# Patient Record
Sex: Female | Born: 1935
Health system: Southern US, Community
[De-identification: ages and names within clinical notes are randomized; demographics above are authoritative.]

## PROBLEM LIST (undated history)

## (undated) DIAGNOSIS — E669 Obesity, unspecified: Secondary | ICD-10-CM

## (undated) DIAGNOSIS — K219 Gastro-esophageal reflux disease without esophagitis: Secondary | ICD-10-CM

## (undated) DIAGNOSIS — E119 Type 2 diabetes mellitus without complications: Secondary | ICD-10-CM

## (undated) DIAGNOSIS — M069 Rheumatoid arthritis, unspecified: Secondary | ICD-10-CM

## (undated) DIAGNOSIS — I10 Essential (primary) hypertension: Secondary | ICD-10-CM

## (undated) DIAGNOSIS — K922 Gastrointestinal hemorrhage, unspecified: Secondary | ICD-10-CM

## (undated) DIAGNOSIS — G8929 Other chronic pain: Secondary | ICD-10-CM

## (undated) DIAGNOSIS — M1990 Unspecified osteoarthritis, unspecified site: Secondary | ICD-10-CM

## (undated) DIAGNOSIS — Z789 Other specified health status: Secondary | ICD-10-CM

## (undated) DIAGNOSIS — M797 Fibromyalgia: Secondary | ICD-10-CM

## (undated) DIAGNOSIS — M542 Cervicalgia: Secondary | ICD-10-CM

## (undated) HISTORY — PX: TOTAL HIP ARTHROPLASTY: SHX124

## (undated) HISTORY — PX: JOINT REPLACEMENT: SHX530

## (undated) HISTORY — PX: TUBAL LIGATION: SHX77

---

## 2012-11-21 DIAGNOSIS — H04129 Dry eye syndrome of unspecified lacrimal gland: Secondary | ICD-10-CM | POA: Diagnosis not present

## 2012-11-21 DIAGNOSIS — H25019 Cortical age-related cataract, unspecified eye: Secondary | ICD-10-CM | POA: Diagnosis not present

## 2012-11-21 DIAGNOSIS — E119 Type 2 diabetes mellitus without complications: Secondary | ICD-10-CM | POA: Diagnosis not present

## 2012-11-21 DIAGNOSIS — H2510 Age-related nuclear cataract, unspecified eye: Secondary | ICD-10-CM | POA: Diagnosis not present

## 2012-11-29 ENCOUNTER — Emergency Department (INDEPENDENT_AMBULATORY_CARE_PROVIDER_SITE_OTHER)
Admission: EM | Admit: 2012-11-29 | Discharge: 2012-11-29 | Disposition: A | Discharge status: Home / Self Care | Payer: Medicare Other | Source: Home / Self Care | Attending: Emergency Medicine | Admitting: Emergency Medicine

## 2012-11-29 ENCOUNTER — Encounter (HOSPITAL_COMMUNITY): Payer: Self-pay | Admitting: *Deleted

## 2012-11-29 ENCOUNTER — Emergency Department (INDEPENDENT_AMBULATORY_CARE_PROVIDER_SITE_OTHER): Payer: Medicare Other

## 2012-11-29 DIAGNOSIS — M47812 Spondylosis without myelopathy or radiculopathy, cervical region: Secondary | ICD-10-CM | POA: Diagnosis not present

## 2012-11-29 DIAGNOSIS — M542 Cervicalgia: Secondary | ICD-10-CM

## 2012-11-29 DIAGNOSIS — I10 Essential (primary) hypertension: Secondary | ICD-10-CM

## 2012-11-29 DIAGNOSIS — M5030 Other cervical disc degeneration, unspecified cervical region: Secondary | ICD-10-CM | POA: Diagnosis not present

## 2012-11-29 HISTORY — DX: Essential (primary) hypertension: I10

## 2012-11-29 HISTORY — DX: Unspecified osteoarthritis, unspecified site: M19.90

## 2012-11-29 HISTORY — DX: Type 2 diabetes mellitus without complications: E11.9

## 2012-11-29 HISTORY — DX: Obesity, unspecified: E66.9

## 2012-11-29 HISTORY — DX: Rheumatoid arthritis, unspecified: M06.9

## 2012-11-29 HISTORY — DX: Fibromyalgia: M79.7

## 2012-11-29 LAB — POCT I-STAT, CHEM 8
Chloride: 99 mEq/L (ref 96–112)
Glucose, Bld: 164 mg/dL — ABNORMAL HIGH (ref 70–99)
HCT: 45 % (ref 36.0–46.0)
Potassium: 3.7 mEq/L (ref 3.5–5.1)
Sodium: 139 mEq/L (ref 135–145)

## 2012-11-29 MED ORDER — DIAZEPAM 2 MG PO TABS: 2.0000 mg | ORAL_TABLET | Freq: Four times a day (QID) | ORAL | Status: DC | PRN

## 2012-11-29 MED ORDER — MELOXICAM 7.5 MG PO TABS: 7.5000 mg | ORAL_TABLET | Freq: Every day | ORAL | Status: DC

## 2012-11-29 MED ORDER — TRAMADOL HCL 50 MG PO TABS: 50.0000 mg | ORAL_TABLET | Freq: Four times a day (QID) | ORAL | Status: DC | PRN

## 2012-11-29 MED ORDER — HYDROCHLOROTHIAZIDE 25 MG PO TABS: 25.0000 mg | ORAL_TABLET | Freq: Every day | ORAL | Status: DC

## 2012-11-29 NOTE — ED Provider Notes (Signed)
History     CSN: [id number]  Arrival date & time 11/29/12  1458   First MD Initiated Contact with Patient 11/29/12 1744      Chief Complaint  Patient presents with  . Neck Pain  . Arm Pain  . Shoulder Pain    (Consider location/radiation/quality/duration/timing/severity/associated sxs/prior treatment) Patient is a 77 y.o. female presenting with neck pain and hypertension. The history is provided by the patient.  Neck Pain  This is a new problem. The current episode started more than 1 week ago (many months ago). The problem occurs daily. The problem has not changed since onset.The pain is associated with nothing. There has been no fever. The pain is present in the left side. The quality of the pain is described as aching. The pain radiates to the left shoulder and left arm. The pain is at a severity of 9/10. The symptoms are aggravated by bending, twisting and position. The pain is the same all the time. Stiffness is present in the morning. Associated symptoms include tingling. Pertinent negatives include no chest pain and no headaches. She has tried nothing for the symptoms.  Hypertension This is a chronic problem. The problem has not changed since onset.Pertinent negatives include no chest pain, no abdominal pain, no headaches and no shortness of breath. Nothing aggravates the symptoms. The symptoms are relieved by medications. She has tried nothing for the symptoms.  Patient reports she stopped taking medications related to abdominal pain many months ago.    Past Medical History  Diagnosis Date  . Diabetes mellitus without complication   . Hypertension   . Rheumatoid arthritis   . Osteoarthritis   . Fibromyalgia   . Obesity     Past Surgical History  Procedure Date  . Joint replacement     bilat hips  . Tubal ligation     No family history on file.  History  Substance Use Topics  . Smoking status: Never Smoker   . Smokeless tobacco: Not on file  . Alcohol Use: No     OB History    Grav Para Term Preterm Abortions TAB SAB Ect Mult Living                  Review of Systems  HENT: Positive for neck pain.   Respiratory: Negative for shortness of breath.   Cardiovascular: Negative for chest pain.  Gastrointestinal: Negative for abdominal pain.  Musculoskeletal: Positive for arthralgias.  Neurological: Positive for tingling. Negative for headaches.  All other systems reviewed and are negative.    Allergies  Penicillins  Home Medications   Current Outpatient Rx  Name  Route  Sig  Dispense  Refill  . NOVOLOG [place]   Subcutaneous   Inject 25 Units into the skin 2 (two) times daily.         . TRAMADOL HCL PO   Oral   Take by mouth 2 (two) times daily.         Marland Kitchen DIAZEPAM 2 MG PO TABS   Oral   Take 1 tablet (2 mg total) by mouth every 6 (six) hours as needed.   20 tablet   0   . HYDROCHLOROTHIAZIDE 25 MG PO TABS   Oral   Take 1 tablet (25 mg total) by mouth daily.   30 tablet   0   . MELOXICAM 7.5 MG PO TABS   Oral   Take 1 tablet (7.5 mg total) by mouth daily.   30 tablet   1   .  TRAMADOL HCL 50 MG PO TABS   Oral   Take 1 tablet (50 mg total) by mouth every 6 (six) hours as needed for pain.   30 tablet   0     BP 196/100  Pulse 91  Temp 98.3 F (36.8 C) (Oral)  Resp 18  SpO2 98%  Physical Exam  Nursing note and vitals reviewed. Constitutional: She is oriented to person, place, and time. Vital signs are normal. She appears well-developed and well-nourished. She is active and cooperative. No distress.  HENT:  Head: Normocephalic.  Eyes: Conjunctivae normal and EOM are normal. Pupils are equal, round, and reactive to light. No scleral icterus.  Neck: Trachea normal. Neck supple. No hepatojugular reflux present. Muscular tenderness present. No spinous process tenderness present. Carotid bruit is not present. Decreased range of motion present. No thyromegaly present.       Left sided muscle tenderness   Cardiovascular: Normal rate, regular rhythm, normal heart sounds, intact distal pulses and normal pulses.        No pedal edema  Pulmonary/Chest: Effort normal and breath sounds normal.  Lymphadenopathy:       Head (right side): No submental, no submandibular, no tonsillar, no preauricular, no posterior auricular and no occipital adenopathy present.       Head (left side): No submental, no submandibular, no tonsillar, no preauricular, no posterior auricular and no occipital adenopathy present.    She has no cervical adenopathy.  Neurological: She is alert and oriented to person, place, and time. She has normal strength. No cranial nerve deficit or sensory deficit. Coordination and gait normal. GCS eye subscore is 4. GCS verbal subscore is 5. GCS motor subscore is 6.  Skin: Skin is warm and dry. She is not diaphoretic.  Psychiatric: She has a normal mood and affect. Her speech is normal and behavior is normal. Judgment and thought content normal. Cognition and memory are normal.    ED Course  Procedures (including critical care time)  Labs Reviewed  POCT I-STAT, CHEM 8 - Abnormal; Notable for the following:    Glucose, Bld 164 (*)     Hemoglobin 15.3 (*)     All other components within normal limits   Dg Cervical Spine Complete  11/29/2012  *RADIOLOGY REPORT*  Clinical Data: Neck pain.  Left shoulder and arm pain.  CERVICAL SPINE - COMPLETE 4+ VIEW  Comparison: None.  Findings: There is prevertebral soft tissue swelling.  Does the patient have a fever or sore throat?  There is only minimal narrowing of the C3-4 disc space.  There is 3 mm spondylolisthesis of C3 on C4 and 2 mm spondylolisthesis at C4- C5.  The patient has severe right facet arthritis at C2-3, C3-4, and C4-5 and severe left facet arthritis at C1-2.  The left neural foramina are widely patent.  There is a right foraminal stenosis at C3-4.  There is calcification in the nuchal ligament at C5 06/07.  IMPRESSION: Prominence of the  prevertebral soft tissues.  This could be due to the patient's body habitus but the possibility of infection should be considered if the patient has any symptoms of infection.  Multilevel degenerative facet arthritis.   Original Report Authenticated By: Francene Boyers, M.D.      1. Cervicalgia   2. Hypertension       MDM  Medications as prescribed, follow up with [facility] for effective management of your chronic health condition.  Keep a log of your blood pressure for review.  RTC  prn       Johnsie Kindred, NP 11/29/12 1905

## 2012-11-29 NOTE — ED Notes (Signed)
C/O chronic left neck pain that radiates into left shoulder and down into RUE with intermittent tingling in left hand & all digits.  Has had this pain x 1 yr & has been taking tramadol bid for pain; came to [facility] today because "the pain is so bad, I just can't take it anymore; I can't sleep".  Has not f/u w/ PCP.  Neck pain feels like a stiffness; has difficulty moving head due to pain.  Denies HA.  Pt is supposed to be taking Lantus and lisinopril, but has not taken either x 1 month due to side effects.  Strongly encouraged pt to f/u w/ PCP.  Checks CBGs regularly, and states it is well-controlled and normally runs around 180.

## 2012-11-30 NOTE — ED Provider Notes (Signed)
Medical screening examination/treatment/procedure(s) were performed by non-physician practitioner and as supervising physician I was immediately available for consultation/collaboration.  Leslee Home, M.D.   Reuben Likes, MD 11/30/12 [phone]

## 2013-10-24 ENCOUNTER — Encounter (HOSPITAL_COMMUNITY): Payer: Self-pay | Admitting: Emergency Medicine

## 2013-10-24 DIAGNOSIS — IMO0001 Reserved for inherently not codable concepts without codable children: Secondary | ICD-10-CM | POA: Diagnosis not present

## 2013-10-24 DIAGNOSIS — M069 Rheumatoid arthritis, unspecified: Secondary | ICD-10-CM | POA: Insufficient documentation

## 2013-10-24 DIAGNOSIS — E119 Type 2 diabetes mellitus without complications: Secondary | ICD-10-CM | POA: Insufficient documentation

## 2013-10-24 DIAGNOSIS — L988 Other specified disorders of the skin and subcutaneous tissue: Secondary | ICD-10-CM | POA: Diagnosis not present

## 2013-10-24 DIAGNOSIS — I10 Essential (primary) hypertension: Secondary | ICD-10-CM | POA: Insufficient documentation

## 2013-10-24 DIAGNOSIS — Z794 Long term (current) use of insulin: Secondary | ICD-10-CM | POA: Insufficient documentation

## 2013-10-24 DIAGNOSIS — M1990 Unspecified osteoarthritis, unspecified site: Secondary | ICD-10-CM | POA: Insufficient documentation

## 2013-10-24 DIAGNOSIS — Z880 Allergy status to penicillin: Secondary | ICD-10-CM | POA: Insufficient documentation

## 2013-10-24 DIAGNOSIS — E669 Obesity, unspecified: Secondary | ICD-10-CM | POA: Diagnosis not present

## 2013-10-24 NOTE — ED Notes (Signed)
The pt is c/o a lesion on her lt thigh that  It has been there for 3 years and is getting worse and more painful.  She is a diabetic

## 2013-10-25 ENCOUNTER — Emergency Department (HOSPITAL_COMMUNITY)
Admission: EM | Admit: 2013-10-25 | Discharge: 2013-10-25 | Disposition: A | Discharge status: Home / Self Care | Payer: Medicare Other | Attending: Emergency Medicine | Admitting: Emergency Medicine

## 2013-10-25 DIAGNOSIS — L989 Disorder of the skin and subcutaneous tissue, unspecified: Secondary | ICD-10-CM

## 2013-10-25 MED ORDER — LOSARTAN POTASSIUM-HCTZ 50-12.5 MG PO TABS: 1.0000 | ORAL_TABLET | Freq: Every day | ORAL | Status: DC

## 2013-10-25 NOTE — ED Notes (Signed)
Dr Read Drivers in room

## 2013-10-25 NOTE — ED Provider Notes (Addendum)
CSN: [id number]     Arrival date & time 10/24/13  2232 History   First MD Initiated Contact with Patient 10/25/13 0030     Chief Complaint  Patient presents with  . Thigh Lesion    (Consider location/radiation/quality/duration/timing/severity/associated sxs/prior Treatment) HPI This is a 77 year old female with long-standing history of pedunculated lesion on her left medial thigh. Over the past several days has become abraded and painful. Her daughter has been putting an over-the-counter skin tag around the on it which is only worsened its pain. There is no redness, drainage or erythema associated with it. The pain is moderate and worse with walking, sitting in certain positions. She is heavyset and has had bilateral hip replacements which makes it difficult to separate her thighs.  The patient is new to town and requests a refill of her Hyzaar pending establishment with a PCP.  Past Medical History  Diagnosis Date  . Diabetes mellitus without complication   . Hypertension   . Rheumatoid arthritis(714.0)   . Osteoarthritis   . Fibromyalgia   . Obesity    Past Surgical History  Procedure Laterality Date  . Joint replacement      bilat hips  . Tubal ligation     No family history on file. History  Substance Use Topics  . Smoking status: Never Smoker   . Smokeless tobacco: Not on file  . Alcohol Use: No   OB History   Grav Para Term Preterm Abortions TAB SAB Ect Mult Living                 Review of Systems  All other systems reviewed and are negative.    Allergies  Penicillins  Home Medications   Current Outpatient Rx  Name  Route  Sig  Dispense  Refill  . acetaminophen (TYLENOL) 500 MG tablet   Oral   Take 250 mg by mouth every 6 (six) hours as needed for moderate pain.         Marland Kitchen insulin aspart (NOVOLOG) 100 UNIT/ML injection   Subcutaneous   Inject 25 Units into the skin 3 (three) times daily before meals.         . insulin glargine (LANTUS) 100 UNIT/ML  injection   Subcutaneous   Inject 35 Units into the skin at bedtime.          BP 215/105  Pulse 106  Temp(Src) 98.3 F (36.8 C)  Resp 20  Ht 5\' 6"  (1.676 m)  Wt 272 lb (123.378 kg)  BMI 43.92 kg/m2  SpO2 97%  Physical Exam General: Well-developed, obese female in no acute distress; appearance consistent with age of record HENT: normocephalic; atraumatic Eyes: pupils equal, round and reactive to light; extraocular muscles intact; arcus senilis bilaterally Neck: supple Heart: regular rate and rhythm Lungs: clear to auscultation bilaterally Abdomen: soft; obese; nontender Extremities: No deformity; limited range of motion at hips Neurologic: Awake, alert and oriented; motor function intact in all extremities and symmetric; no facial droop Skin: Warm and dry; approximately 2 cm, cauliflower-like pedunculated lesion of left posterior medial thigh. Psychiatric: Normal mood and affect    ED Course  Procedures (including critical care time)  EXCISIONAL BIOPSY Performed by: Chele Cornell L Authorized by: Hanley Seamen Consent: Verbal consent obtained. Risks and benefits: risks, benefits and alternatives were discussed Consent given by: patient Patient identity confirmed: provided demographic data Prepped and Draped in normal sterile fashion Wound explored  Lesion Location: Left posteromedial thigh  Anesthesia: local infiltration  Local anesthetic: lidocaine  2% with epinephrine  Anesthetic total: 1.5 ml  Excision: Pedunculated lesion was removed at the base with an elliptical incision  Excisional Wound Length: 1 cm  Skin closure: 5-0 Prolene   Number of sutures: Four   Technique: Simple interrupted   Patient tolerance: Patient tolerated the procedure well with no immediate complications.   MDM  Excised lesion was sent for histopathology.    Hanley Seamen, MD 10/25/13 0107  Hanley Seamen, MD 10/25/13 1610

## 2013-10-26 NOTE — ED Notes (Signed)
Patient informed that results of negative pathology results per Dr Read Drivers.

## 2013-11-02 ENCOUNTER — Encounter (HOSPITAL_COMMUNITY): Payer: Self-pay | Admitting: Emergency Medicine

## 2013-11-02 ENCOUNTER — Emergency Department (INDEPENDENT_AMBULATORY_CARE_PROVIDER_SITE_OTHER)
Admission: EM | Admit: 2013-11-02 | Discharge: 2013-11-02 | Disposition: A | Discharge status: Home / Self Care | Payer: Medicare Other | Source: Home / Self Care | Attending: Family Medicine | Admitting: Family Medicine

## 2013-11-02 DIAGNOSIS — Z4802 Encounter for removal of sutures: Secondary | ICD-10-CM

## 2013-11-02 NOTE — ED Notes (Signed)
Pt is here to have sutures removed from left inner thigh Voices no new concerns... Alert w/no signs of acute distress.

## 2013-11-02 NOTE — ED Provider Notes (Signed)
CSN: [id number]     Arrival date & time 11/02/13  1638 History   First MD Initiated Contact with Patient 11/02/13 1838     No chief complaint on file.  (Consider location/radiation/quality/duration/timing/severity/associated sxs/prior Treatment) Patient is a 77 y.o. female presenting with suture removal. The history is provided by the patient.  Suture / Staple Removal This is a new problem. The current episode started more than 1 week ago (lesion on left thigh removed in ED on 12/7, well healed, 4 sutures removed.). The problem has been resolved.    Past Medical History  Diagnosis Date  . Diabetes mellitus without complication   . Hypertension   . Rheumatoid arthritis(714.0)   . Osteoarthritis   . Fibromyalgia   . Obesity    Past Surgical History  Procedure Laterality Date  . Joint replacement      bilat hips  . Tubal ligation     No family history on file. History  Substance Use Topics  . Smoking status: Never Smoker   . Smokeless tobacco: Not on file  . Alcohol Use: No   OB History   Grav Para Term Preterm Abortions TAB SAB Ect Mult Living                 Review of Systems  Constitutional: Negative.   Skin: Positive for wound.    Allergies  Penicillins  Home Medications   Current Outpatient Rx  Name  Route  Sig  Dispense  Refill  . acetaminophen (TYLENOL) 500 MG tablet   Oral   Take 250 mg by mouth every 6 (six) hours as needed for moderate pain.         Marland Kitchen insulin aspart (NOVOLOG) 100 UNIT/ML injection   Subcutaneous   Inject 25 Units into the skin 3 (three) times daily before meals.         . insulin glargine (LANTUS) 100 UNIT/ML injection   Subcutaneous   Inject 35 Units into the skin at bedtime.         Marland Kitchen losartan-hydrochlorothiazide (HYZAAR) 50-12.5 MG per tablet   Oral   Take 1 tablet by mouth daily.   30 tablet   0    There were no vitals taken for this visit. Physical Exam  Nursing note and vitals reviewed. Constitutional: She is  oriented to person, place, and time. She appears well-developed and well-nourished.  Neurological: She is alert and oriented to person, place, and time.  Skin: Skin is warm and dry.  Site well healed, 4 stitches removed.    ED Course  Procedures (including critical care time) Labs Review Labs Reviewed - No data to display Imaging Review No results found.  EKG Interpretation    Date/Time:    Ventricular Rate:    PR Interval:    QRS Duration:   QT Interval:    QTC Calculation:   R Axis:     Text Interpretation:              MDM      Linna Hoff, MD 11/02/13 [phone]

## 2013-11-17 ENCOUNTER — Emergency Department (INDEPENDENT_AMBULATORY_CARE_PROVIDER_SITE_OTHER)
Admission: EM | Admit: 2013-11-17 | Discharge: 2013-11-17 | Disposition: A | Discharge status: Home / Self Care | Payer: Medicare Other | Source: Home / Self Care | Attending: Family Medicine | Admitting: Family Medicine

## 2013-11-17 ENCOUNTER — Encounter (HOSPITAL_COMMUNITY): Payer: Self-pay | Admitting: Emergency Medicine

## 2013-11-17 DIAGNOSIS — R300 Dysuria: Secondary | ICD-10-CM

## 2013-11-17 MED ORDER — CIPROFLOXACIN HCL 500 MG PO TABS: 500.0000 mg | ORAL_TABLET | Freq: Two times a day (BID) | ORAL | Status: DC

## 2013-11-17 NOTE — ED Provider Notes (Signed)
Brittany Macdonald is a 77 y.o. female who presents to Urgent Care today for pelvic pain and dysuria. This is been present for the last week or so. Patient recently had a lesion removed from her left thigh. Her pain is been present in around the same time that happened. She denies any no vomiting diarrhea fevers or chills. She notes mild abdominal pain. No vaginal discharge or bleeding. Patient feels well otherwise.   Patient notes her symptoms are consistent with UTI  Past Medical History  Diagnosis Date  . Diabetes mellitus without complication   . Hypertension   . Rheumatoid arthritis(714.0)   . Osteoarthritis   . Fibromyalgia   . Obesity    History  Substance Use Topics  . Smoking status: Never Smoker   . Smokeless tobacco: Not on file  . Alcohol Use: No   ROS as above Medications reviewed. No current facility-administered medications for this encounter.   Current Outpatient Prescriptions  Medication Sig Dispense Refill  . acetaminophen (TYLENOL) 500 MG tablet Take 250 mg by mouth every 6 (six) hours as needed for moderate pain.      . ciprofloxacin (CIPRO) 500 MG tablet Take 1 tablet (500 mg total) by mouth 2 (two) times daily.  10 tablet  0  . insulin aspart (NOVOLOG) 100 UNIT/ML injection Inject 25 Units into the skin 3 (three) times daily before meals.      . insulin glargine (LANTUS) 100 UNIT/ML injection Inject 35 Units into the skin at bedtime.      Marland Kitchen losartan-hydrochlorothiazide (HYZAAR) 50-12.5 MG per tablet Take 1 tablet by mouth daily.  30 tablet  0    Exam:  BP 154/108  Pulse 109  Temp(Src) 98.7 F (37.1 C) (Oral)  Resp 18  SpO2 100% Gen: Well NAD HEENT: EOMI,  MMM Lungs: Normal work of breathing. CTABL Heart: RRR no MRG Abd: NABS, Soft. NT, ND Exts: Non edematous BL  LE, warm and well perfused.  Left leg: Well appearing maturing scar in her thigh. Small  Patient waited one hour but was unable to provide urine sample  Assessment and Plan: 77 y.o.  female with dysuria. UTI most likely etiology. Unable to obtain urine sample. Will treat with ciprofloxacin this patient is penicillin allergic. Followup with primary care provider. Discussed warning signs or symptoms. Please see discharge instructions. Patient expresses understanding.      Rodolph Bong, MD 11/17/13 [phone]

## 2013-11-17 NOTE — ED Notes (Signed)
C/o lesion removed on left leg  States she has burning and pain in the leg now States this is a follow up

## 2013-11-17 NOTE — ED Notes (Signed)
Patient could not void at this time 

## 2013-11-22 DIAGNOSIS — M129 Arthropathy, unspecified: Secondary | ICD-10-CM | POA: Diagnosis not present

## 2013-11-22 DIAGNOSIS — I10 Essential (primary) hypertension: Secondary | ICD-10-CM | POA: Diagnosis not present

## 2013-11-22 DIAGNOSIS — E119 Type 2 diabetes mellitus without complications: Secondary | ICD-10-CM | POA: Diagnosis not present

## 2014-05-26 ENCOUNTER — Encounter (HOSPITAL_COMMUNITY): Payer: Self-pay | Admitting: Emergency Medicine

## 2014-05-26 ENCOUNTER — Observation Stay (HOSPITAL_COMMUNITY)
Admission: EM | Admit: 2014-05-26 | Discharge: 2014-05-29 | Disposition: A | Discharge status: Home / Self Care | Payer: Medicare Other | Attending: Internal Medicine | Admitting: Internal Medicine

## 2014-05-26 ENCOUNTER — Observation Stay (HOSPITAL_COMMUNITY): Payer: Medicare Other

## 2014-05-26 ENCOUNTER — Emergency Department (INDEPENDENT_AMBULATORY_CARE_PROVIDER_SITE_OTHER)
Admission: EM | Admit: 2014-05-26 | Discharge: 2014-05-26 | Disposition: A | Discharge status: Nursing Home (Includes ICF) | Payer: Medicare Other | Source: Home / Self Care | Attending: Emergency Medicine | Admitting: Emergency Medicine

## 2014-05-26 DIAGNOSIS — D126 Benign neoplasm of colon, unspecified: Secondary | ICD-10-CM | POA: Insufficient documentation

## 2014-05-26 DIAGNOSIS — M542 Cervicalgia: Secondary | ICD-10-CM | POA: Insufficient documentation

## 2014-05-26 DIAGNOSIS — K921 Melena: Secondary | ICD-10-CM

## 2014-05-26 DIAGNOSIS — K7689 Other specified diseases of liver: Secondary | ICD-10-CM | POA: Insufficient documentation

## 2014-05-26 DIAGNOSIS — Z794 Long term (current) use of insulin: Secondary | ICD-10-CM | POA: Diagnosis not present

## 2014-05-26 DIAGNOSIS — D509 Iron deficiency anemia, unspecified: Secondary | ICD-10-CM | POA: Diagnosis not present

## 2014-05-26 DIAGNOSIS — K2970 Gastritis, unspecified, without bleeding: Secondary | ICD-10-CM | POA: Diagnosis not present

## 2014-05-26 DIAGNOSIS — I2510 Atherosclerotic heart disease of native coronary artery without angina pectoris: Secondary | ICD-10-CM | POA: Diagnosis not present

## 2014-05-26 DIAGNOSIS — R918 Other nonspecific abnormal finding of lung field: Secondary | ICD-10-CM | POA: Diagnosis not present

## 2014-05-26 DIAGNOSIS — K5730 Diverticulosis of large intestine without perforation or abscess without bleeding: Secondary | ICD-10-CM | POA: Insufficient documentation

## 2014-05-26 DIAGNOSIS — Z860100 Personal history of colon polyps, unspecified: Secondary | ICD-10-CM | POA: Diagnosis not present

## 2014-05-26 DIAGNOSIS — M069 Rheumatoid arthritis, unspecified: Secondary | ICD-10-CM | POA: Insufficient documentation

## 2014-05-26 DIAGNOSIS — Z96649 Presence of unspecified artificial hip joint: Secondary | ICD-10-CM | POA: Insufficient documentation

## 2014-05-26 DIAGNOSIS — IMO0001 Reserved for inherently not codable concepts without codable children: Secondary | ICD-10-CM

## 2014-05-26 DIAGNOSIS — K2990 Gastroduodenitis, unspecified, without bleeding: Secondary | ICD-10-CM | POA: Diagnosis not present

## 2014-05-26 DIAGNOSIS — K922 Gastrointestinal hemorrhage, unspecified: Secondary | ICD-10-CM | POA: Diagnosis not present

## 2014-05-26 DIAGNOSIS — E119 Type 2 diabetes mellitus without complications: Secondary | ICD-10-CM | POA: Insufficient documentation

## 2014-05-26 DIAGNOSIS — K625 Hemorrhage of anus and rectum: Secondary | ICD-10-CM | POA: Diagnosis not present

## 2014-05-26 DIAGNOSIS — Z6841 Body Mass Index (BMI) 40.0 and over, adult: Secondary | ICD-10-CM | POA: Insufficient documentation

## 2014-05-26 DIAGNOSIS — R1031 Right lower quadrant pain: Secondary | ICD-10-CM | POA: Insufficient documentation

## 2014-05-26 DIAGNOSIS — G8929 Other chronic pain: Secondary | ICD-10-CM | POA: Insufficient documentation

## 2014-05-26 DIAGNOSIS — I10 Essential (primary) hypertension: Secondary | ICD-10-CM | POA: Diagnosis not present

## 2014-05-26 DIAGNOSIS — R911 Solitary pulmonary nodule: Secondary | ICD-10-CM

## 2014-05-26 DIAGNOSIS — M1990 Unspecified osteoarthritis, unspecified site: Secondary | ICD-10-CM | POA: Diagnosis not present

## 2014-05-26 DIAGNOSIS — R160 Hepatomegaly, not elsewhere classified: Secondary | ICD-10-CM

## 2014-05-26 DIAGNOSIS — Q393 Congenital stenosis and stricture of esophagus: Secondary | ICD-10-CM

## 2014-05-26 DIAGNOSIS — E669 Obesity, unspecified: Secondary | ICD-10-CM | POA: Insufficient documentation

## 2014-05-26 DIAGNOSIS — R6889 Other general symptoms and signs: Secondary | ICD-10-CM | POA: Diagnosis not present

## 2014-05-26 DIAGNOSIS — Q391 Atresia of esophagus with tracheo-esophageal fistula: Secondary | ICD-10-CM | POA: Diagnosis not present

## 2014-05-26 HISTORY — DX: Other chronic pain: G89.29

## 2014-05-26 HISTORY — DX: Cervicalgia: M54.2

## 2014-05-26 LAB — CBC WITH DIFFERENTIAL/PLATELET
BASOS ABS: 0 10*3/uL (ref 0.0–0.1)
BASOS PCT: 0 % (ref 0–1)
EOS ABS: 0.1 10*3/uL (ref 0.0–0.7)
EOS PCT: 2 % (ref 0–5)
HCT: 37 % (ref 36.0–46.0)
Hemoglobin: 12.1 g/dL (ref 12.0–15.0)
Lymphocytes Relative: 46 % (ref 12–46)
Lymphs Abs: 2.6 10*3/uL (ref 0.7–4.0)
MCH: 29.2 pg (ref 26.0–34.0)
MCHC: 32.7 g/dL (ref 30.0–36.0)
MCV: 89.4 fL (ref 78.0–100.0)
Monocytes Absolute: 0.3 10*3/uL (ref 0.1–1.0)
Monocytes Relative: 4 % (ref 3–12)
NEUTROS PCT: 48 % (ref 43–77)
Neutro Abs: 2.8 10*3/uL (ref 1.7–7.7)
PLATELETS: 246 10*3/uL (ref 150–400)
RBC: 4.14 MIL/uL (ref 3.87–5.11)
RDW: 13.1 % (ref 11.5–15.5)
WBC: 5.7 10*3/uL (ref 4.0–10.5)

## 2014-05-26 LAB — CBG MONITORING, ED
GLUCOSE-CAPILLARY: 80 mg/dL (ref 70–99)
Glucose-Capillary: 76 mg/dL (ref 70–99)

## 2014-05-26 LAB — PROTIME-INR
INR: 1.05 (ref 0.00–1.49)
PROTHROMBIN TIME: 13.7 s (ref 11.6–15.2)

## 2014-05-26 LAB — COMPREHENSIVE METABOLIC PANEL
ALBUMIN: 3.8 g/dL (ref 3.5–5.2)
ALT: 17 U/L (ref 0–35)
AST: 26 U/L (ref 0–37)
Alkaline Phosphatase: 92 U/L (ref 39–117)
Anion gap: 13 (ref 5–15)
BUN: 11 mg/dL (ref 6–23)
CALCIUM: 9.6 mg/dL (ref 8.4–10.5)
CO2: 25 mEq/L (ref 19–32)
Chloride: 100 mEq/L (ref 96–112)
Creatinine, Ser: 0.64 mg/dL (ref 0.50–1.10)
GFR calc non Af Amer: 85 mL/min — ABNORMAL LOW (ref >90)
Glucose, Bld: 102 mg/dL — ABNORMAL HIGH (ref 70–99)
Potassium: 4.1 mEq/L (ref 3.7–5.3)
SODIUM: 138 meq/L (ref 137–147)
TOTAL PROTEIN: 8 g/dL (ref 6.0–8.3)
Total Bilirubin: 0.3 mg/dL (ref 0.3–1.2)

## 2014-05-26 LAB — SAMPLE TO BLOOD BANK

## 2014-05-26 LAB — GLUCOSE, CAPILLARY
GLUCOSE-CAPILLARY: 298 mg/dL — AB (ref 70–99)
Glucose-Capillary: 152 mg/dL — ABNORMAL HIGH (ref 70–99)

## 2014-05-26 LAB — OCCULT BLOOD, POC DEVICE: Fecal Occult Bld: POSITIVE — AB

## 2014-05-26 LAB — LACTIC ACID, PLASMA: LACTIC ACID, VENOUS: 2 mmol/L (ref 0.5–2.2)

## 2014-05-26 LAB — LIPASE, BLOOD: Lipase: 35 U/L (ref 11–59)

## 2014-05-26 MED ORDER — SODIUM CHLORIDE 0.9 % IV SOLN
Freq: Once | INTRAVENOUS | Status: AC
  Administered 2014-05-26: 13:00:00 via INTRAVENOUS

## 2014-05-26 MED ORDER — ONDANSETRON HCL 4 MG/2ML IJ SOLN: 4.0000 mg | Freq: Four times a day (QID) | INTRAMUSCULAR | Status: DC | PRN

## 2014-05-26 MED ORDER — PANTOPRAZOLE SODIUM 40 MG PO TBEC: 40.0000 mg | DELAYED_RELEASE_TABLET | Freq: Two times a day (BID) | ORAL | Status: DC

## 2014-05-26 MED ORDER — SODIUM CHLORIDE 0.9 % IV SOLN
INTRAVENOUS | Status: DC
  Administered 2014-05-26 – 2014-05-28 (×3): via INTRAVENOUS

## 2014-05-26 MED ORDER — PANTOPRAZOLE SODIUM 40 MG IV SOLR
40.0000 mg | Freq: Two times a day (BID) | INTRAVENOUS | Status: DC
  Administered 2014-05-26: 40 mg via INTRAVENOUS
  Filled 2014-05-26 (×3): qty 40

## 2014-05-26 MED ORDER — INSULIN ASPART 100 UNIT/ML ~~LOC~~ SOLN
0.0000 [IU] | Freq: Three times a day (TID) | SUBCUTANEOUS | Status: DC
  Administered 2014-05-27 (×2): 3 [IU] via SUBCUTANEOUS
  Administered 2014-05-27 – 2014-05-28 (×2): 2 [IU] via SUBCUTANEOUS
  Administered 2014-05-28: 3 [IU] via SUBCUTANEOUS
  Administered 2014-05-28: 2 [IU] via SUBCUTANEOUS
  Administered 2014-05-29: 3 [IU] via SUBCUTANEOUS

## 2014-05-26 MED ORDER — ONDANSETRON HCL 4 MG PO TABS: 4.0000 mg | ORAL_TABLET | Freq: Four times a day (QID) | ORAL | Status: DC | PRN

## 2014-05-26 MED ORDER — ACETAMINOPHEN 500 MG PO TABS: 250.0000 mg | ORAL_TABLET | Freq: Four times a day (QID) | ORAL | Status: DC | PRN

## 2014-05-26 MED ORDER — HYDROCODONE-ACETAMINOPHEN 5-325 MG PO TABS: 1.0000 | ORAL_TABLET | ORAL | Status: DC | PRN

## 2014-05-26 NOTE — ED Notes (Signed)
Reports passing bright red blood with BM x 2 days.   Denies constipation or diarrhea.  States "I had a pain in lower right abdomen that only last for about 1 min and had a BM with bright red blood".   Denies any other symptoms.

## 2014-05-26 NOTE — ED Provider Notes (Signed)
CSN: [id number]     Arrival date & time 05/26/14  1049 History   First MD Initiated Contact with Patient 05/26/14 1216     Chief Complaint  Patient presents with  . Rectal Bleeding   (Consider location/radiation/quality/duration/timing/severity/associated sxs/prior Treatment) HPI Comments: PCP: [facility] Last colonoscopy about 5 years ago. Was informed that she had two polyp and that the study was otherwise normal.  Does not take any prescription anticoagulants, but does often take [place] [employer] for arthritis pain relief.  No known hx of hemorrhoids Reports that yesterday she had some vague right mid to lower quadrant abdominal discomfort but that has since resolved.  No fever, N/V/D.    Patient is a 78 y.o. female presenting with hematochezia. The history is provided by the patient.  Rectal Bleeding Quality:  Maroon and black and tarry Duration:  2 days Timing:  Constant Progression:  Unchanged Chronicity:  New Context: spontaneously   Similar prior episodes: no   Associated symptoms: no dizziness, no epistaxis, no fever, no hematemesis, no light-headedness, no loss of consciousness, no recent illness and no vomiting   Risk factors: NSAID use   Risk factors: no anticoagulant use, no hx of colorectal cancer, no hx of colorectal surgery, no hx of IBD, no liver disease and no steroid use     Past Medical History  Diagnosis Date  . Diabetes mellitus without complication   . Hypertension   . Rheumatoid arthritis(714.0)   . Osteoarthritis   . Fibromyalgia   . Obesity    Past Surgical History  Procedure Laterality Date  . Joint replacement      bilat hips  . Tubal ligation     History reviewed. No pertinent family history. History  Substance Use Topics  . Smoking status: Never Smoker   . Smokeless tobacco: Not on file  . Alcohol Use: No   OB History   Grav Para Term Preterm Abortions TAB SAB Ect Mult Living                 Review of Systems   Constitutional: Negative for fever.  HENT: Negative for nosebleeds.   Gastrointestinal: Positive for hematochezia. Negative for vomiting and hematemesis.  Neurological: Negative for dizziness, loss of consciousness and light-headedness.  All other systems reviewed and are negative.   Allergies  Penicillins and Ivp dye  Home Medications   Prior to Admission medications   Medication Sig Start Date End Date Taking? Authorizing Provider  insulin aspart (NOVOLOG) 100 UNIT/ML injection Inject 25 Units into the skin 3 (three) times daily before meals.   Yes Historical Provider, MD  insulin glargine (LANTUS) 100 UNIT/ML injection Inject 35 Units into the skin at bedtime.   Yes Historical Provider, MD  acetaminophen (TYLENOL) 500 MG tablet Take 250 mg by mouth every 6 (six) hours as needed for moderate pain.    Historical Provider, MD  ciprofloxacin (CIPRO) 500 MG tablet Take 1 tablet (500 mg total) by mouth 2 (two) times daily. 11/17/13   Gregor Hams, MD  losartan-hydrochlorothiazide (HYZAAR) 50-12.5 MG per tablet Take 1 tablet by mouth daily. 10/25/13   John L Molpus, MD   BP 190/79  Pulse 68  Temp(Src) 98.7 F (37.1 C) (Oral)  Resp 18  SpO2 100% Physical Exam  Nursing note and vitals reviewed. Constitutional: She is oriented to person, place, and time. She appears well-developed and well-nourished. No distress.  HENT:  Head: Normocephalic and atraumatic.  Eyes: Conjunctivae are normal. No scleral icterus.  Cardiovascular: Normal rate, regular rhythm and normal heart sounds.   Pulmonary/Chest: Effort normal and breath sounds normal.  Abdominal: Soft. Bowel sounds are normal. She exhibits no distension. There is no tenderness.  +obese  Genitourinary: Rectal exam shows no external hemorrhoid, no internal hemorrhoid, no fissure, no mass, no tenderness and anal tone normal. Guaiac positive stool.  +soft dark maroon melena  Musculoskeletal: Normal range of motion.  Neurological: She is  alert and oriented to person, place, and time.  Skin: Skin is warm and dry. No rash noted. No erythema. No pallor.  Psychiatric: She has a normal mood and affect. Her behavior is normal.    ED Course  Procedures (including critical care time) Labs Review Labs Reviewed  OCCULT BLOOD X 1 CARD TO LAB, STOOL    Imaging Review No results found.   MDM   1. Gastrointestinal hemorrhage with melena   IV NS established and patient transferred to [facility] ER for further evaluation and treatment.     [place], [place] 05/26/14 1316

## 2014-05-26 NOTE — ED Notes (Signed)
PT is [facility] transfer via [facility] (Pt lives at home with daughter) with c/o 3 episodes of bright red blood in her stool. Pt had two episodes yesterday and one episode today. Pt reports her stool has been thin but formed, denies NVD. Pt reports she had RLQ pain yesterday but denies pain today. Pt tender on palpation only in RUQ. Pt hypertensive and has been out of her Hyzaar x51mo, states she will be able to refill it this month.

## 2014-05-26 NOTE — ED Notes (Signed)
MD at bedside. 

## 2014-05-26 NOTE — ED Notes (Signed)
Called CT and pt will be taken back in ~1hr

## 2014-05-26 NOTE — Progress Notes (Signed)
Attempted to call ED.  ED nurse will call back in a few minutes.

## 2014-05-26 NOTE — ED Provider Notes (Signed)
CSN: [id number]     Arrival date & time 05/26/14  1319 History   First MD Initiated Contact with Patient 05/26/14 1321     Chief Complaint  Patient presents with  . GI Bleeding      HPI Pt was seen at 1330.  Per pt, c/o gradual onset and persistence of constant "black and blood in stools" for the past 3 days. Has been associated with multiple intermittent episodes of "loose stools" and generalized abd "pain." Describes the abd pain as "sore."  Pt was evaluated at [facility], exam revealed heme positive maroon stool in rectal vault, and pt was sent to the ED for further evaluation. Denies N/V, no diarrhea, no rectal pain, no fevers, no back pain, no rash, no CP/SOB.      PMD: [facility] Past Medical History  Diagnosis Date  . Diabetes mellitus without complication   . Hypertension   . Rheumatoid arthritis(714.0)   . Osteoarthritis   . Fibromyalgia   . Obesity   . Chronic neck pain    Past Surgical History  Procedure Laterality Date  . Joint replacement      bilat hips  . Tubal ligation      History  Substance Use Topics  . Smoking status: Never Smoker   . Smokeless tobacco: Not on file  . Alcohol Use: No    Review of Systems ROS: Statement: All systems negative except as marked or noted in the HPI; Constitutional: Negative for fever and chills. ; ; Eyes: Negative for eye pain, redness and discharge. ; ; ENMT: Negative for ear pain, hoarseness, nasal congestion, sinus pressure and sore throat. ; ; Cardiovascular: Negative for chest pain, palpitations, diaphoresis, dyspnea and peripheral edema. ; ; Respiratory: Negative for cough, wheezing and stridor. ; ; Gastrointestinal: +abd pain, blood in stool. Negative for nausea, vomiting, diarrhea, hematemesis, jaundice. . ; ; Genitourinary: Negative for dysuria, flank pain and hematuria. ; ; Musculoskeletal: Negative for back pain and neck pain. Negative for swelling and trauma.; ; Skin: Negative for pruritus, rash, abrasions, blisters,  bruising and skin lesion.; ; Neuro: Negative for headache, lightheadedness and neck stiffness. Negative for weakness, altered level of consciousness , altered mental status, extremity weakness, paresthesias, involuntary movement, seizure and syncope.     Allergies  Penicillins and Ivp dye  Home Medications   Prior to Admission medications   Medication Sig Start Date End Date Taking? Authorizing Provider  acetaminophen (TYLENOL) 500 MG tablet Take 250 mg by mouth every 6 (six) hours as needed for moderate pain.   Yes Historical Provider, MD  insulin aspart (NOVOLOG) 100 UNIT/ML injection Inject 25 Units into the skin 3 (three) times daily before meals.   Yes Historical Provider, MD  insulin glargine (LANTUS) 100 UNIT/ML injection Inject 35 Units into the skin at bedtime.   Yes Historical Provider, MD   BP 159/73  Pulse 67  Temp(Src) 98 F (36.7 C) (Oral)  Resp 18  SpO2 96% Physical Exam 1335: Physical examination:  Nursing notes reviewed; Vital signs and O2 SAT reviewed;  Constitutional: Well developed, Well nourished, Well hydrated, In no acute distress; Head:  Normocephalic, atraumatic; Eyes: EOMI, PERRL, No scleral icterus; ENMT: Mouth and pharynx normal, Mucous membranes moist; Neck: Supple, Full range of motion, No lymphadenopathy; Cardiovascular: Regular rate and rhythm, No gallop; Respiratory: Breath sounds clear & equal bilaterally, No rales, rhonchi, wheezes.  Speaking full sentences with ease, Normal respiratory effort/excursion; Chest: Nontender, Movement normal; Abdomen: Soft, +mild diffuse tenderness to palp. No  rebound or guarding. Nondistended, Normal bowel sounds; Genitourinary: No CVA tenderness; Extremities: Pulses normal, No tenderness, No edema, No calf edema or asymmetry.; Neuro: AA&Ox3, Major CN grossly intact.  Speech clear. No gross focal motor or sensory deficits in extremities. Climbs on and off stretcher easily by herself. Gait steady.; Skin: Color normal, Warm,  Dry.   ED Course  Procedures     EKG Interpretation None      MDM  MDM Reviewed: previous chart, nursing note and vitals Reviewed previous: labs Interpretation: labs and CT scan   Results for orders placed during the hospital encounter of 05/26/14  CBC WITH DIFFERENTIAL      Result Value Ref Range   WBC 5.7  4.0 - 10.5 K/uL   RBC 4.14  3.87 - 5.11 MIL/uL   Hemoglobin 12.1  12.0 - 15.0 g/dL   HCT 37.0  36.0 - 46.0 %   MCV 89.4  78.0 - 100.0 fL   MCH 29.2  26.0 - 34.0 pg   MCHC 32.7  30.0 - 36.0 g/dL   RDW 13.1  11.5 - 15.5 %   Platelets 246  150 - 400 K/uL   Neutrophils Relative % 48  43 - 77 %   Neutro Abs 2.8  1.7 - 7.7 K/uL   Lymphocytes Relative 46  12 - 46 %   Lymphs Abs 2.6  0.7 - 4.0 K/uL   Monocytes Relative 4  3 - 12 %   Monocytes Absolute 0.3  0.1 - 1.0 K/uL   Eosinophils Relative 2  0 - 5 %   Eosinophils Absolute 0.1  0.0 - 0.7 K/uL   Basophils Relative 0  0 - 1 %   Basophils Absolute 0.0  0.0 - 0.1 K/uL  COMPREHENSIVE METABOLIC PANEL      Result Value Ref Range   Sodium 138  137 - 147 mEq/L   Potassium 4.1  3.7 - 5.3 mEq/L   Chloride 100  96 - 112 mEq/L   CO2 25  19 - 32 mEq/L   Glucose, Bld 102 (*) 70 - 99 mg/dL   BUN 11  6 - 23 mg/dL   Creatinine, Ser 0.64  0.50 - 1.10 mg/dL   Calcium 9.6  8.4 - 10.5 mg/dL   Total Protein 8.0  6.0 - 8.3 g/dL   Albumin 3.8  3.5 - 5.2 g/dL   AST 26  0 - 37 U/L   ALT 17  0 - 35 U/L   Alkaline Phosphatase 92  39 - 117 U/L   Total Bilirubin 0.3  0.3 - 1.2 mg/dL   GFR calc non Af Amer 85 (*) >90 mL/min   GFR calc Af Amer >90  >90 mL/min   Anion gap 13  5 - 15  LIPASE, BLOOD      Result Value Ref Range   Lipase 35  11 - 59 U/L  LACTIC ACID, PLASMA      Result Value Ref Range   Lactic Acid, Venous 2.0  0.5 - 2.2 mmol/L  PROTIME-INR      Result Value Ref Range   Prothrombin Time 13.7  11.6 - 15.2 seconds   INR 1.05  0.00 - 1.49  CBG MONITORING, ED      Result Value Ref Range   Glucose-Capillary 76  70 - 99  mg/dL  SAMPLE TO BLOOD BANK      Result Value Ref Range   Blood Bank Specimen SAMPLE AVAILABLE FOR TESTING     Sample Expiration  05/27/2014       1620:  Maroon stool in rectal vault, heme positive. CT A/P pending. Dx and testing d/w pt and family.  Questions answered.  Verb understanding, agreeable to admit.  T/C to [facility]. Tyrell Antonio, case discussed, including:  HPI, pertinent PM/SHx, VS/PE, dx testing, ED course and treatment:  Agreeable to admit, requests to consult GI MD, write temporary orders, obtain medical bed to team 10. T/C to GI MD, case discussed, including:  HPI, pertinent PM/SHx, VS/PE, dx testing, ED course and treatment:  Agreeable to consult.       Alfonzo Feller, DO 05/28/14 1357

## 2014-05-26 NOTE — H&P (Addendum)
[facility] History and Physical  Brittany Macdonald ZPH:[id number] DOB: 04/13/1936 DOA: 05/26/2014  Referring physician: Dr Thurnell Garbe.  PCP: No PCP Per Patient   Chief Complaint: Black stool.   HPI: Brittany Macdonald is a 78 y.o. female with PMH significant for Diabetes, HTN, RA,  Who presents complaining of black stool, and bright red blood per rectum for last 2 days. It is accompanied with cramping abdominal pain. She denies nausea, vomiting, weight loss, fever, chest pain or dyspnea. She relates 2 Bowel movement per day. Last Black BM was this am.     Review of Systems:  Negative, except as per HPI.   Past Medical History  Diagnosis Date  . Diabetes mellitus without complication   . Hypertension   . Rheumatoid arthritis(714.0)   . Osteoarthritis   . Fibromyalgia   . Obesity   . Chronic neck pain    Past Surgical History  Procedure Laterality Date  . Joint replacement      bilat hips  . Tubal ligation     Social History:  reports that she has never smoked. She does not have any smokeless tobacco history on file. She reports that she does not drink alcohol or use illicit drugs.  Allergies  Allergen Reactions  . Penicillins Anaphylaxis  . Ivp Dye [Iodinated Diagnostic Agents] Other (See Comments)    Goes into shock    Family History: Mother history of diabetes and heart diseases. Father history of lung cancer.   Prior to Admission medications   Medication Sig Start Date End Date Taking? Authorizing Provider  acetaminophen (TYLENOL) 500 MG tablet Take 250 mg by mouth every 6 (six) hours as needed for moderate pain.   Yes Historical Provider, MD  insulin aspart (NOVOLOG) 100 UNIT/ML injection Inject 25 Units into the skin 3 (three) times daily before meals.   Yes Historical Provider, MD  insulin glargine (LANTUS) 100 UNIT/ML injection Inject 35 Units into the skin at bedtime.   Yes Historical Provider, MD   Physical Exam: Filed Vitals:   05/26/14 1434  BP: 159/73    Pulse: 67  Temp:   Resp: 18    BP 159/73  Pulse 67  Temp(Src) 98 F (36.7 C) (Oral)  Resp 18  SpO2 96%  General:  Appears calm and comfortable Eyes: PERRL, normal lids, irises & conjunctiva ENT: grossly normal hearing, lips & tongue Neck: no LAD, masses or thyromegaly Cardiovascular: RRR, no m/r/g. No LE edema. Respiratory: CTA bilaterally, no w/r/r. Normal respiratory effort. Abdomen: soft, mild tenderness, no rigidity.  Skin: no rash or induration seen on limited exam Musculoskeletal: grossly normal tone BUE/BLE Neurologic: grossly non-focal.          Labs on Admission:  Basic Metabolic Panel:  Recent Labs Lab 05/26/14 1352  NA 138  K 4.1  CL 100  CO2 25  GLUCOSE 102*  BUN 11  CREATININE 0.64  CALCIUM 9.6   Liver Function Tests:  Recent Labs Lab 05/26/14 1352  AST 26  ALT 17  ALKPHOS 92  BILITOT 0.3  PROT 8.0  ALBUMIN 3.8    Recent Labs Lab 05/26/14 1352  LIPASE 35   No results found for this basename: AMMONIA,  in the last 168 hours CBC:  Recent Labs Lab 05/26/14 1352  WBC 5.7  NEUTROABS 2.8  HGB 12.1  HCT 37.0  MCV 89.4  PLT 246   Cardiac Enzymes: No results found for this basename: CKTOTAL, CKMB, CKMBINDEX, TROPONINI,  in the last 168 hours  BNP (last  3 results) No results found for this basename: PROBNP,  in the last 8760 hours CBG:  Recent Labs Lab 05/26/14 1542  GLUCAP 76    Radiological Exams on Admission: Ct Abdomen Pelvis Wo Contrast  05/26/2014   CLINICAL DATA:  GI bleed.  Pain.  EXAM: CT ABDOMEN AND PELVIS WITHOUT CONTRAST  TECHNIQUE: Multidetector CT imaging of the abdomen and pelvis was performed following the standard protocol without IV contrast.  COMPARISON:  None.  FINDINGS: Ill-defined lucency measuring approximately 5.1 cm is noted in the right hepatic lobe just superior to the gallbladder. This could be infectious or malignant. Small adjacent simple cysts are present. The adjacent gallbladder is nondistended.  Gallbladder wall thickness appears normal. No gallstones are noted by CT. No pericholecystic fluid collections noted. No biliary distention. The pancreas is unremarkable. Spleen is unremarkable.  Adrenals normal. Kidneys normal. No hydronephrosis or obstructing ureteral stone. Bladder is nondistended.  No significant adenopathy. Abdominal aorta normal caliber. Atherosclerotic vascular changes noted of the abdominal aorta.  Appendiceal region is normal. There is no bowel distention. No free air. No mesenteric mass. The stomach is nondistended. Small sliding hiatal hernia. Tiny umbilical hernia with herniation of fat only.  Coronary artery disease. Multiple nonspecific pulmonary nodules. Calcification of the left lower lobe nodule. These tiny nonspecific nodules are most likely granulomas. A a baseline nonenhanced chest CT can be obtained to further evaluate. Diffuse thoracolumbar degenerative change. Bilateral hip replacements.  IMPRESSION: 1. Ill-defined hepatic lesion measuring approximately 5.1 cm in the right hepatic lobe just superior to the gallbladder. Differential diagnosis includes infectious or malignant etiologies. The adjacent gallbladder appears unremarkable by CT. No biliary distention. 2. Coronary artery disease. 3. Multiple nonspecific pulmonary nodules. Calcification noted in a left lower lobe nodule. These tiny nonspecific nodules are most likely granulomas. A baseline nonenhanced chest CT can be obtained to further evaluate.   Electronically Signed   By: Marcello Moores  Register   On: 05/26/2014 16:54    EKG; ordered.   Assessment/Plan Active Problems:   GI bleeding  1-GI Bleed, melena, Hematochezia: Patient Vital stable, HB at 12. Admit to regular bed. Repeat labs in am. IV protonix. GI consulted. Patient is Fara Boros wishes would not accept Blood transfusion. Endoscopy in am. If significant drop in hemoglobin patient might need IV iron and or EPO.   2-Liver lesion, Mass: Does she needs MRI, will  follow GI recommendation.   3-Multiple lung nodule by Ct abdomen; she will need CT chest at some point.  4-Diabetes: will hold meal coverage, patient will be NPO after midnight. Will hold lantus dose. Resume lantus tomorrow after endoscopy.  5-Hypertension: Has not been taking medications due to financial reason. Monitor BP for now due to concern for GI bleed. She was taking previously Hyzaar.   Code Status: Full Code.  Family Communication: care discussed with granddaughter  Disposition Plan: expect 3 to 4 days.   Time spent: 75 minutes.   Niel Hummer A [facility] Pager [phone]  **Disclaimer: This note may have been dictated with voice recognition software. Similar sounding words can inadvertently be transcribed and this note may contain transcription errors which may not have been corrected upon publication of note.**

## 2014-05-26 NOTE — Consult Note (Signed)
[facility] Consult: 5:12 PM 05/26/2014  LOS: 0 days    Referring Provider: Tyrell Antonio md  Primary Care Physician:  No PCP Per Patient Primary Gastroenterologist:  unassigned    Reason for Consultation:  Bloody stool   HPI: Brittany Macdonald is a 78 y.o. female.  Insulin requiring diabetic  Began passing formed, black stool mixed with red blood as well 2 days ago.  Pain in RLQ yesterday, not today.  This is an older occurrence and pain resolves after BMs.  The pt came to ED Hgb is 12.  BUN and coags normal.  Had minor bleeding PR about 3 months ago.  Was taking [employer] back and body med until last week for arthritis pain.   BP stable.  CT abd pelvis shows s lesion in right hepatic lobe.  She is [employer].   Colonoscopy with polyps and EGD with what sounds like gastritis about 6 yrs ago in [place] No anorexia, no nausea, no weight loss.  No change in bowel habits.  Will get sonstipated if she does not eat her oatmeal.  Never has taken PPI, occasional TUMs only.   Past Medical History  Diagnosis Date  . Diabetes mellitus without complication   . Hypertension   . Rheumatoid arthritis(714.0)   . Osteoarthritis   . Fibromyalgia   . Obesity   . Chronic neck pain     Past Surgical History  Procedure Laterality Date  . Joint replacement      bilat hips  . Tubal ligation      Prior to Admission medications   Medication Sig Start Date End Date Taking? Authorizing Provider  acetaminophen (TYLENOL) 500 MG tablet Take 250 mg by mouth every 6 (six) hours as needed for moderate pain.   Yes Historical Provider, MD  insulin aspart (NOVOLOG) 100 UNIT/ML injection Inject 25 Units into the skin 3 (three) times daily before meals.   Yes Historical Provider, MD  insulin glargine (LANTUS) 100 UNIT/ML  injection Inject 35 Units into the skin at bedtime.   Yes Historical Provider, MD    Scheduled Meds: . pantoprazole  40 mg Oral BID   Infusions: . sodium chloride     PRN Meds:    Allergies as of 05/26/2014 - Review Complete 05/26/2014  Allergen Reaction Noted  . Penicillins Anaphylaxis 11/29/2012  . Ivp dye [iodinated diagnostic agents] Other (See Comments) 05/26/2014    No family history on file.  History   Social History  . Marital Status: Widowed    Spouse Name: N/A    Number of Children: N/A  . Years of Education: N/A   Occupational History  . Not on file.   Social History Main Topics  . Smoking status: Never Smoker   . Smokeless tobacco: Not on file  . Alcohol Use: No  . Drug Use: No  . Sexual Activity: Not on file   Other Topics Concern  . Not on file   Social History Narrative  . No narrative on file    REVIEW OF SYSTEMS: Constitutional:  Not dizzy or  weak.  No weight loss ENT:  No nose bleeds Pulm:  No SOB CV:  No palpitations, no LE edema.  GU:  No hematuria, no frequency GI:  No heartburn or nausea Heme:  No anemia or need of iron in past   Transfusions:  None.  She is [facility] Neuro:  No headaches, no peripheral tingling or numbness Derm:  No itching, no rash or sores.  Endocrine:  No sweats or chills.  No polyuria or dysuria Immunization:  Not asked Travel:  None beyond local counties in last few months.    PHYSICAL EXAM: Vital signs in last 24 hours: Filed Vitals:   05/26/14 1434  BP: 159/73  Pulse: 67  Temp:   Resp: 18   Wt Readings from Last 3 Encounters:  10/24/13 123.378 kg (272 lb)    General: alert, comfortable  Looks  well Head:  No swelling  Eyes:  No icterus or pallor Ears:  Not HOH  Nose:  No  discharge Mouth:  Some missing teeth, no blood or sores Neck:  No JVD, no mass Lungs:  Clear bil  .  Breathing easily Heart: RRR.  No mrg Abdomen:  Soft, obese, no mass, no HSM.   Rectal: not repeated, dark and  Hem + per ED    Musc/Skeltl: no joint contracuture Extremities:  No pedal edema  Neurologic:  Oriented x 3.  Good historian.  No tremor.  No limb weakness Skin:  No sores, no telangectasia Tattoos:  none   Psych:  Relaxed, pleasant.    LAB RESULTS:  Recent Labs  05/26/14 1352  WBC 5.7  HGB 12.1  HCT 37.0  PLT 246   BMET Lab Results  Component Value Date   NA 138 05/26/2014   NA 139 11/29/2012   K 4.1 05/26/2014   K 3.7 11/29/2012   CL 100 05/26/2014   CL 99 11/29/2012   CO2 25 05/26/2014   GLUCOSE 102* 05/26/2014   GLUCOSE 164* 11/29/2012   BUN 11 05/26/2014   BUN 9 11/29/2012   CREATININE 0.64 05/26/2014   CREATININE 0.90 11/29/2012   CALCIUM 9.6 05/26/2014   LFT  Recent Labs  05/26/14 1352  PROT 8.0  ALBUMIN 3.8  AST 26  ALT 17  ALKPHOS 92  BILITOT 0.3   PT/INR Lab Results  Component Value Date   INR 1.05 05/26/2014     RADIOLOGY STUDIES: Ct Abdomen Pelvis Wo Contrast 05/26/2014  COMPARISON:  None.  FINDINGS: Ill-defined lucency measuring approximately 5.1 cm is noted in the right hepatic lobe just superior to the gallbladder. This could be infectious or malignant. Small adjacent simple cysts are present. The adjacent gallbladder is nondistended. Gallbladder wall thickness appears normal. No gallstones are noted by CT. No pericholecystic fluid collections noted. No biliary distention. The pancreas is unremarkable. Spleen is unremarkable.  Adrenals normal. Kidneys normal. No hydronephrosis or obstructing ureteral stone. Bladder is nondistended.  No significant adenopathy. Abdominal aorta normal caliber. Atherosclerotic vascular changes noted of the abdominal aorta.  Appendiceal region is normal. There is no bowel distention. No free air. No mesenteric mass. The stomach is nondistended. Small sliding hiatal hernia. Tiny umbilical hernia with herniation of fat only.  Coronary artery disease. Multiple nonspecific pulmonary nodules. Calcification of the left lower lobe nodule. These tiny nonspecific  nodules are most likely granulomas. A a baseline nonenhanced chest CT can be obtained to further evaluate. Diffuse thoracolumbar degenerative change. Bilateral hip replacements.  IMPRESSION: 1. Ill-defined hepatic lesion measuring approximately 5.1 cm in  the right hepatic lobe just superior to the gallbladder. Differential diagnosis includes infectious or malignant etiologies. The adjacent gallbladder appears unremarkable by CT. No biliary distention. 2. Coronary artery disease. 3. Multiple nonspecific pulmonary nodules. Calcification noted in a left lower lobe nodule. These tiny nonspecific nodules are most likely granulomas. A baseline nonenhanced chest CT can be obtained to further evaluate.   Electronically Signed   By: Marcello Moores  Register   On: 05/26/2014 16:54    ENDOSCOPIC STUDIES: Previous colonoscopy with polyps and  EGD with ?gastritis in [place] 5 to 6 yrs ago:  Polyps of undefined type  IMPRESSION:   *  Black and bloody stool for  2 days *  Lesion in right lobe of liver.  *  Hx colon polyps *  Hx (gastritis?) *  IDDM *  CAD by CT scan     PLAN:     *  EGD tomorrow. *  Twice daily PPI *  Carb mod diet, npo post midnight.  CBC in AM *  Further imaging of liver?    Azucena Freed  05/26/2014, 5:12 PM Pager: [phone]    ________________________________________________________________________  Velora Heckler GI MD note:  I personally examined the patient, reviewed the data and agree with the assessment and plan described above.  EGD.  If it is normal, then will likely recommend colonoscopy tomorrow and MRI of liver to better assess the lesion described by CT.   Owens Loffler, MD [facility] Pager [phone]

## 2014-05-26 NOTE — ED Notes (Signed)
Patient is [employer] and does not accept blood products

## 2014-05-27 ENCOUNTER — Encounter (HOSPITAL_COMMUNITY): Payer: Self-pay | Admitting: Gastroenterology

## 2014-05-27 ENCOUNTER — Observation Stay (HOSPITAL_COMMUNITY): Payer: Medicare Other

## 2014-05-27 ENCOUNTER — Encounter (HOSPITAL_COMMUNITY)
Admission: EM | Disposition: A | Discharge status: Home / Self Care | Payer: Self-pay | Source: Home / Self Care | Attending: Internal Medicine

## 2014-05-27 DIAGNOSIS — K921 Melena: Secondary | ICD-10-CM | POA: Diagnosis not present

## 2014-05-27 DIAGNOSIS — K2990 Gastroduodenitis, unspecified, without bleeding: Secondary | ICD-10-CM

## 2014-05-27 DIAGNOSIS — K922 Gastrointestinal hemorrhage, unspecified: Secondary | ICD-10-CM | POA: Diagnosis present

## 2014-05-27 DIAGNOSIS — K2970 Gastritis, unspecified, without bleeding: Secondary | ICD-10-CM

## 2014-05-27 DIAGNOSIS — R160 Hepatomegaly, not elsewhere classified: Secondary | ICD-10-CM

## 2014-05-27 HISTORY — PX: ESOPHAGOGASTRODUODENOSCOPY: SHX5428

## 2014-05-27 LAB — CBC
HCT: 32.3 % — ABNORMAL LOW (ref 36.0–46.0)
HEMOGLOBIN: 10.7 g/dL — AB (ref 12.0–15.0)
MCH: 29.1 pg (ref 26.0–34.0)
MCHC: 33.1 g/dL (ref 30.0–36.0)
MCV: 87.8 fL (ref 78.0–100.0)
PLATELETS: 223 10*3/uL (ref 150–400)
RBC: 3.68 MIL/uL — ABNORMAL LOW (ref 3.87–5.11)
RDW: 13.2 % (ref 11.5–15.5)
WBC: 6 10*3/uL (ref 4.0–10.5)

## 2014-05-27 LAB — BASIC METABOLIC PANEL
Anion gap: 14 (ref 5–15)
BUN: 9 mg/dL (ref 6–23)
CHLORIDE: 98 meq/L (ref 96–112)
CO2: 24 meq/L (ref 19–32)
Calcium: 8.8 mg/dL (ref 8.4–10.5)
Creatinine, Ser: 0.6 mg/dL (ref 0.50–1.10)
GFR calc Af Amer: >90 mL/min (ref >90)
GFR calc non Af Amer: 87 mL/min — ABNORMAL LOW (ref >90)
GLUCOSE: 242 mg/dL — AB (ref 70–99)
Potassium: 3.9 mEq/L (ref 3.7–5.3)
SODIUM: 136 meq/L — AB (ref 137–147)

## 2014-05-27 LAB — GLUCOSE, CAPILLARY
Glucose-Capillary: 230 mg/dL — ABNORMAL HIGH (ref 70–99)
Glucose-Capillary: 197 mg/dL — ABNORMAL HIGH (ref 70–99)
Glucose-Capillary: 203 mg/dL — ABNORMAL HIGH (ref 70–99)
Glucose-Capillary: 165 mg/dL — ABNORMAL HIGH (ref 70–99)
Glucose-Capillary: 220 mg/dL — ABNORMAL HIGH (ref 70–99)

## 2014-05-27 LAB — POCT I-STAT, CHEM 8
BUN: 10 mg/dL (ref 6–23)
CREATININE: 0.7 mg/dL (ref 0.50–1.10)
Calcium, Ion: 1.18 mmol/L (ref 1.13–1.30)
Chloride: 102 mEq/L (ref 96–112)
GLUCOSE: 133 mg/dL — AB (ref 70–99)
HCT: 39 % (ref 36.0–46.0)
HEMOGLOBIN: 13.3 g/dL (ref 12.0–15.0)
Potassium: 3.8 mEq/L (ref 3.7–5.3)
Sodium: 140 mEq/L (ref 137–147)
TCO2: 27 mmol/L (ref 0–100)

## 2014-05-27 SURGERY — EGD (ESOPHAGOGASTRODUODENOSCOPY)
Anesthesia: Moderate Sedation

## 2014-05-27 MED ORDER — MIDAZOLAM HCL 5 MG/ML IJ SOLN
INTRAMUSCULAR | Status: AC
  Filled 2014-05-27: qty 2

## 2014-05-27 MED ORDER — PEG-KCL-NACL-NASULF-NA ASC-C 100 G PO SOLR: 1.0000 | Freq: Once | ORAL | Status: DC

## 2014-05-27 MED ORDER — HYDRALAZINE HCL 20 MG/ML IJ SOLN: 10.0000 mg | Freq: Four times a day (QID) | INTRAMUSCULAR | Status: DC | PRN

## 2014-05-27 MED ORDER — LORAZEPAM 2 MG/ML IJ SOLN
1.0000 mg | Freq: Once | INTRAMUSCULAR | Status: AC
  Administered 2014-05-27: 1 mg via INTRAVENOUS
  Filled 2014-05-27: qty 1

## 2014-05-27 MED ORDER — FENTANYL CITRATE 0.05 MG/ML IJ SOLN
INTRAMUSCULAR | Status: AC
  Filled 2014-05-27: qty 2

## 2014-05-27 MED ORDER — MIDAZOLAM HCL 10 MG/2ML IJ SOLN
INTRAMUSCULAR | Status: DC | PRN
  Administered 2014-05-27: 1 mg via INTRAVENOUS
  Administered 2014-05-27 (×2): 2 mg via INTRAVENOUS

## 2014-05-27 MED ORDER — DIPHENHYDRAMINE HCL 50 MG/ML IJ SOLN
INTRAMUSCULAR | Status: AC
Start: 2014-05-27 — End: 2014-05-27
  Filled 2014-05-27: qty 1

## 2014-05-27 MED ORDER — INSULIN GLARGINE 100 UNIT/ML ~~LOC~~ SOLN
10.0000 [IU] | Freq: Every day | SUBCUTANEOUS | Status: DC
  Administered 2014-05-28 (×2): 10 [IU] via SUBCUTANEOUS
  Filled 2014-05-27 (×3): qty 0.1

## 2014-05-27 MED ORDER — BUTAMBEN-TETRACAINE-BENZOCAINE 2-2-14 % EX AERO
INHALATION_SPRAY | CUTANEOUS | Status: DC | PRN
  Administered 2014-05-27: 2 via TOPICAL

## 2014-05-27 MED ORDER — METOPROLOL TARTRATE 12.5 MG HALF TABLET
12.5000 mg | ORAL_TABLET | Freq: Two times a day (BID) | ORAL | Status: DC
  Administered 2014-05-27 – 2014-05-29 (×4): 12.5 mg via ORAL
  Filled 2014-05-27 (×5): qty 1

## 2014-05-27 MED ORDER — GADOXETATE DISODIUM 0.25 MMOL/ML IV SOLN
10.0000 mL | Freq: Once | INTRAVENOUS | Status: AC | PRN
  Administered 2014-05-27: 10 mL via INTRAVENOUS

## 2014-05-27 MED ORDER — PEG-KCL-NACL-NASULF-NA ASC-C 100 G PO SOLR
0.5000 | Freq: Once | ORAL | Status: AC
  Administered 2014-05-28: 100 g via ORAL

## 2014-05-27 MED ORDER — PANTOPRAZOLE SODIUM 40 MG PO TBEC
40.0000 mg | DELAYED_RELEASE_TABLET | Freq: Two times a day (BID) | ORAL | Status: DC
  Administered 2014-05-27 – 2014-05-29 (×5): 40 mg via ORAL
  Filled 2014-05-27 (×5): qty 1

## 2014-05-27 MED ORDER — PEG-KCL-NACL-NASULF-NA ASC-C 100 G PO SOLR
0.5000 | Freq: Once | ORAL | Status: AC
  Administered 2014-05-27: 100 g via ORAL
  Filled 2014-05-27 (×2): qty 1

## 2014-05-27 MED ORDER — FENTANYL CITRATE 0.05 MG/ML IJ SOLN
INTRAMUSCULAR | Status: DC | PRN
  Administered 2014-05-27 (×2): 25 ug via INTRAVENOUS

## 2014-05-27 NOTE — H&P (View-Only) (Signed)
[facility] Consult: 5:12 PM 05/26/2014  LOS: 0 days    Referring Provider: Tyrell Antonio md  Primary Care Physician:  No PCP Per Patient Primary Gastroenterologist:  unassigned    Reason for Consultation:  Bloody stool   HPI: Brittany Macdonald is a 78 y.o. female.  Insulin requiring diabetic  Began passing formed, black stool mixed with red blood as well 2 days ago.  Pain in RLQ yesterday, not today.  This is an older occurrence and pain resolves after BMs.  The pt came to ED Hgb is 12.  BUN and coags normal.  Had minor bleeding PR about 3 months ago.  Was taking Bayer back and body med until last week for arthritis pain.   BP stable.  CT abd pelvis shows s lesion in right hepatic lobe.  She is [employer].   Colonoscopy with polyps and EGD with what sounds like gastritis about 6 yrs ago in [place] No anorexia, no nausea, no weight loss.  No change in bowel habits.  Will get sonstipated if she does not eat her oatmeal.  Never has taken PPI, occasional TUMs only.   Past Medical History  Diagnosis Date  . Diabetes mellitus without complication   . Hypertension   . Rheumatoid arthritis(714.0)   . Osteoarthritis   . Fibromyalgia   . Obesity   . Chronic neck pain     Past Surgical History  Procedure Laterality Date  . Joint replacement      bilat hips  . Tubal ligation      Prior to Admission medications   Medication Sig Start Date End Date Taking? Authorizing Provider  acetaminophen (TYLENOL) 500 MG tablet Take 250 mg by mouth every 6 (six) hours as needed for moderate pain.   Yes Historical Provider, MD  insulin aspart (NOVOLOG) 100 UNIT/ML injection Inject 25 Units into the skin 3 (three) times daily before meals.   Yes Historical Provider, MD  insulin glargine (LANTUS) 100 UNIT/ML  injection Inject 35 Units into the skin at bedtime.   Yes Historical Provider, MD    Scheduled Meds: . pantoprazole  40 mg Oral BID   Infusions: . sodium chloride     PRN Meds:    Allergies as of 05/26/2014 - Review Complete 05/26/2014  Allergen Reaction Noted  . Penicillins Anaphylaxis 11/29/2012  . Ivp dye [iodinated diagnostic agents] Other (See Comments) 05/26/2014    No family history on file.  History   Social History  . Marital Status: Widowed    Spouse Name: N/A    Number of Children: N/A  . Years of Education: N/A   Occupational History  . Not on file.   Social History Main Topics  . Smoking status: Never Smoker   . Smokeless tobacco: Not on file  . Alcohol Use: No  . Drug Use: No  . Sexual Activity: Not on file   Other Topics Concern  . Not on file   Social History Narrative  . No narrative on file    REVIEW OF SYSTEMS: Constitutional:  Not dizzy or  weak.  No weight loss ENT:  No nose bleeds Pulm:  No SOB CV:  No palpitations, no LE edema.  GU:  No hematuria, no frequency GI:  No heartburn or nausea Heme:  No anemia or need of iron in past   Transfusions:  None.  She is [facility] Neuro:  No headaches, no peripheral tingling or numbness Derm:  No itching, no rash or sores.  Endocrine:  No sweats or chills.  No polyuria or dysuria Immunization:  Not asked Travel:  None beyond local counties in last few months.    PHYSICAL EXAM: Vital signs in last 24 hours: Filed Vitals:   05/26/14 1434  BP: 159/73  Pulse: 67  Temp:   Resp: 18   Wt Readings from Last 3 Encounters:  10/24/13 123.378 kg (272 lb)    General: alert, comfortable  Looks  well Head:  No swelling  Eyes:  No icterus or pallor Ears:  Not HOH  Nose:  No  discharge Mouth:  Some missing teeth, no blood or sores Neck:  No JVD, no mass Lungs:  Clear bil  .  Breathing easily Heart: RRR.  No mrg Abdomen:  Soft, obese, no mass, no HSM.   Rectal: not repeated, dark and  Hem + per ED    Musc/Skeltl: no joint contracuture Extremities:  No pedal edema  Neurologic:  Oriented x 3.  Good historian.  No tremor.  No limb weakness Skin:  No sores, no telangectasia Tattoos:  none   Psych:  Relaxed, pleasant.    LAB RESULTS:  Recent Labs  05/26/14 1352  WBC 5.7  HGB 12.1  HCT 37.0  PLT 246   BMET Lab Results  Component Value Date   NA 138 05/26/2014   NA 139 11/29/2012   K 4.1 05/26/2014   K 3.7 11/29/2012   CL 100 05/26/2014   CL 99 11/29/2012   CO2 25 05/26/2014   GLUCOSE 102* 05/26/2014   GLUCOSE 164* 11/29/2012   BUN 11 05/26/2014   BUN 9 11/29/2012   CREATININE 0.64 05/26/2014   CREATININE 0.90 11/29/2012   CALCIUM 9.6 05/26/2014   LFT  Recent Labs  05/26/14 1352  PROT 8.0  ALBUMIN 3.8  AST 26  ALT 17  ALKPHOS 92  BILITOT 0.3   PT/INR Lab Results  Component Value Date   INR 1.05 05/26/2014     RADIOLOGY STUDIES: Ct Abdomen Pelvis Wo Contrast 05/26/2014  COMPARISON:  None.  FINDINGS: Ill-defined lucency measuring approximately 5.1 cm is noted in the right hepatic lobe just superior to the gallbladder. This could be infectious or malignant. Small adjacent simple cysts are present. The adjacent gallbladder is nondistended. Gallbladder wall thickness appears normal. No gallstones are noted by CT. No pericholecystic fluid collections noted. No biliary distention. The pancreas is unremarkable. Spleen is unremarkable.  Adrenals normal. Kidneys normal. No hydronephrosis or obstructing ureteral stone. Bladder is nondistended.  No significant adenopathy. Abdominal aorta normal caliber. Atherosclerotic vascular changes noted of the abdominal aorta.  Appendiceal region is normal. There is no bowel distention. No free air. No mesenteric mass. The stomach is nondistended. Small sliding hiatal hernia. Tiny umbilical hernia with herniation of fat only.  Coronary artery disease. Multiple nonspecific pulmonary nodules. Calcification of the left lower lobe nodule. These tiny nonspecific  nodules are most likely granulomas. A a baseline nonenhanced chest CT can be obtained to further evaluate. Diffuse thoracolumbar degenerative change. Bilateral hip replacements.  IMPRESSION: 1. Ill-defined hepatic lesion measuring approximately 5.1 cm in  the right hepatic lobe just superior to the gallbladder. Differential diagnosis includes infectious or malignant etiologies. The adjacent gallbladder appears unremarkable by CT. No biliary distention. 2. Coronary artery disease. 3. Multiple nonspecific pulmonary nodules. Calcification noted in a left lower lobe nodule. These tiny nonspecific nodules are most likely granulomas. A baseline nonenhanced chest CT can be obtained to further evaluate.   Electronically Signed   By: Marcello Moores  Register   On: 05/26/2014 16:54    ENDOSCOPIC STUDIES: Previous colonoscopy with polyps and  EGD with ?gastritis in [place] 5 to 6 yrs ago:  Polyps of undefined type  IMPRESSION:   *  Black and bloody stool for  2 days *  Lesion in right lobe of liver.  *  Hx colon polyps *  Hx (gastritis?) *  IDDM *  CAD by CT scan     PLAN:     *  EGD tomorrow. *  Twice daily PPI *  Carb mod diet, npo post midnight.  CBC in AM *  Further imaging of liver?    Azucena Freed  05/26/2014, 5:12 PM Pager: [phone]    ________________________________________________________________________  Velora Heckler GI MD note:  I personally examined the patient, reviewed the data and agree with the assessment and plan described above.  EGD.  If it is normal, then will likely recommend colonoscopy tomorrow and MRI of liver to better assess the lesion described by CT.   Owens Loffler, MD [facility] Pager [phone]

## 2014-05-27 NOTE — Care Management Note (Addendum)
    Page 1 of 1   05/27/2014     4:10:22 PM CARE MANAGEMENT NOTE 05/27/2014  Patient:  Brittany Macdonald, Brittany Macdonald   Account Number:  [id number]  Date Initiated:  05/27/2014  Documentation initiated by:  Tomi Bamberger  Subjective/Objective Assessment:   dx gib  admit- lives with daughter.     Action/Plan:   Anticipated DC Date:  05/28/2014   Anticipated DC Plan:  HOME/SELF CARE      DC Planning Services  CM consult      Choice offered to / List presented to:  C-1 Patient   DME arranged  OTHER - SEE COMMENT      DME agency  [place].        Status of service:  In process, will continue to follow Medicare Important Message given?   (If response is "NO", the following Medicare IM given date fields will be blank) Date Medicare IM given:   Medicare IM given by:   Date Additional Medicare IM given:   Additional Medicare IM given by:    Discharge Disposition:    Per UR Regulation:    If discussed at Long Length of Stay Meetings, dates discussed:    Comments:  05/27/14 [place] RN,BSN [phone] patient lives with daughter, she states her daughter helps her with everything and she does not think she needs [place] services.  Patient stated she would like to get a rollator through [facility], referral made to [facility] for rollator, Jermaine notified.  Patient also has apt to see [name] on 7/17 for hospital f/u.   Code 56 was given to patient's daughter , Brittany Macdonald, patient in a procedure.

## 2014-05-27 NOTE — Progress Notes (Signed)
Pt seen and examined. Agree with above assessment and plan. Pt presents with melena with hematochezia. EGD unremarkable. Pt planned for colon tomorrow. GI following. Cont to monitor h/h closely. Will follow.

## 2014-05-27 NOTE — Progress Notes (Signed)
PROGRESS NOTE  Evia Goldsmith DPO:[id number] DOB: 09-Jun-1936 DOA: 05/26/2014 PCP: No PCP Per Patient   Brief narrative:      Brittany Macdonald is a 78 y.o. female with PMH significant for Diabetes, HTN, RA, Who presents complaining of black stool, and bright red blood per rectum for last 2 days. It is accompanied with cramping abdominal pain. She denies nausea, vomiting, weight loss, fever, chest pain or dyspnea. She relates 2 Bowel movement per day. Last Black BM was this am. She states she did have a colonoscopy and EGD approximately 6 years ago but uncertain results. She is a Neurosurgeon witness.       Workup included a CT of the abdomen with incidental finding of right hepatic lobe mass and multiple pulmonary nodules. GI has been asked to consult.  Subjective:  Feeling well. No recurrent BM's overnight.  Assessment/Plan:  GI Bleed, melena, Hematochezia:  -Appreciate GI assisstance -no source of bleed found on EGD. Colonoscopy tomorrow -continue PPI  Anemia  -Hgb 10.7<--12.1 secondary to above -jehovah's witness so may need epo or iron if continues to drop  Liver lesion, Mass -MRI pending  Multiple lung nodule by CT abdomen -she will need CT chest at some point. Will await MRI for liver mass  Diabetes:  -CBGs slightly elevated -will resume lantus at decreased dose, SSI  Hypertension -Has not been taking medications due to financial reason, but now has Medicare -On hyzaar previously but will avoid with current GIB -start metoprolol, monitor   DVT Prophylaxis:  SCDs  Code Status: full code  Disposition Plan: remain inpt  Consultants:  [facility]  Procedures:  EGD 05/27/14 with no source of bleeding found  Antibiotics: Antibiotics Given (last 72 hours)   None      Objective: Filed Vitals:   05/27/14 1225 05/27/14 1230 05/27/14 1240 05/27/14 1310  BP: 117/62  126/63 138/79  Pulse: 74 65  75  Temp:    98.3 F (36.8 C)  TempSrc:    Oral  Resp: 15 15   16   Height:      Weight:      SpO2: 96% 93%  95%    Intake/Output Summary (Last 24 hours) at 05/27/14 1441 Last data filed at 05/27/14 1345  Gross per 24 hour  Intake 506.25 ml  Output      0 ml  Net 506.25 ml   Filed Weights   05/26/14 2038 05/27/14 0527  Weight: 120.203 kg (265 lb) 120 kg (264 lb 8.8 oz)    Exam: General: awake, alert in NAD CV: S1S2 RRR, no m/r/g Resp: CTAB, no w/r/c, no increased wob GI: abdomen obese, soft, NT/ND, BS+ Psych: aox3, very pleasant   Data Reviewed: Basic Metabolic Panel:  Recent Labs Lab 05/26/14 1257 05/26/14 1352 05/27/14 0730  NA 140 138 136*  K 3.8 4.1 3.9  CL 102 100 98  CO2  --  25 24  GLUCOSE 133* 102* 242*  BUN 10 11 9   CREATININE 0.70 0.64 0.60  CALCIUM  --  9.6 8.8   Liver Function Tests:  Recent Labs Lab 05/26/14 1352  AST 26  ALT 17  ALKPHOS 92  BILITOT 0.3  PROT 8.0  ALBUMIN 3.8    Recent Labs Lab 05/26/14 1352  LIPASE 35   No results found for this basename: AMMONIA,  in the last 168 hours CBC:  Recent Labs Lab 05/26/14 1257 05/26/14 1352 05/27/14 0730  WBC  --  5.7 6.0  NEUTROABS  --  2.8  --  HGB 13.3 12.1 10.7*  HCT 39.0 37.0 32.3*  MCV  --  89.4 87.8  PLT  --  246 223   Cardiac Enzymes: No results found for this basename: CKTOTAL, CKMB, CKMBINDEX, TROPONINI,  in the last 168 hours BNP (last 3 results) No results found for this basename: PROBNP,  in the last 8760 hours CBG:  Recent Labs Lab 05/26/14 1843 05/26/14 2205 05/27/14 0752 05/27/14 1120 05/27/14 1307  GLUCAP 152* 298* 230* 197* 203*    No results found for this or any previous visit (from the past 240 hour(s)).   Studies: Ct Abdomen Pelvis Wo Contrast  05/26/2014   CLINICAL DATA:  GI bleed.  Pain.  EXAM: CT ABDOMEN AND PELVIS WITHOUT CONTRAST  TECHNIQUE: Multidetector CT imaging of the abdomen and pelvis was performed following the standard protocol without IV contrast.  COMPARISON:  None.  FINDINGS: Ill-defined  lucency measuring approximately 5.1 cm is noted in the right hepatic lobe just superior to the gallbladder. This could be infectious or malignant. Small adjacent simple cysts are present. The adjacent gallbladder is nondistended. Gallbladder wall thickness appears normal. No gallstones are noted by CT. No pericholecystic fluid collections noted. No biliary distention. The pancreas is unremarkable. Spleen is unremarkable.  Adrenals normal. Kidneys normal. No hydronephrosis or obstructing ureteral stone. Bladder is nondistended.  No significant adenopathy. Abdominal aorta normal caliber. Atherosclerotic vascular changes noted of the abdominal aorta.  Appendiceal region is normal. There is no bowel distention. No free air. No mesenteric mass. The stomach is nondistended. Small sliding hiatal hernia. Tiny umbilical hernia with herniation of fat only.  Coronary artery disease. Multiple nonspecific pulmonary nodules. Calcification of the left lower lobe nodule. These tiny nonspecific nodules are most likely granulomas. A a baseline nonenhanced chest CT can be obtained to further evaluate. Diffuse thoracolumbar degenerative change. Bilateral hip replacements.  IMPRESSION: 1. Ill-defined hepatic lesion measuring approximately 5.1 cm in the right hepatic lobe just superior to the gallbladder. Differential diagnosis includes infectious or malignant etiologies. The adjacent gallbladder appears unremarkable by CT. No biliary distention. 2. Coronary artery disease. 3. Multiple nonspecific pulmonary nodules. Calcification noted in a left lower lobe nodule. These tiny nonspecific nodules are most likely granulomas. A baseline nonenhanced chest CT can be obtained to further evaluate.   Electronically Signed   By: Marcello Moores  Register   On: 05/26/2014 16:54    Scheduled Meds: . insulin aspart  0-9 Units Subcutaneous TID WC  . LORazepam  1 mg Intravenous Once  . pantoprazole  40 mg Oral BID  . peg 3350 powder  0.5 kit Oral Once    And  . [START ON 05/28/2014] peg 3350 powder  0.5 kit Oral Once   Continuous Infusions: . sodium chloride 75 mL/hr at 05/27/14 1321    Active Problems:   GI bleeding   GI bleed   Unspecified gastritis and gastroduodenitis without mention of hemorrhage    Patrici Ranks, NP-C [place]  pgr [phone]   If 7PM-7AM, please contact night-coverage at [email or web address], password [facility] 05/27/2014, 2:41 PM  LOS: 1 day

## 2014-05-27 NOTE — Op Note (Signed)
[facility] [place], [id number]   ENDOSCOPY PROCEDURE REPORT  PATIENT: Brittany, Macdonald  MR#: [id number] BIRTHDATE: 1935/11/21 , 12  yrs. old GENDER: Female ENDOSCOPIST: Milus Banister, MD PROCEDURE DATE:  05/27/2014 PROCEDURE:  EGD w/ biopsy ASA CLASS:     Class III INDICATIONS:  dark stools, anemia; ? lesion in liver. MEDICATIONS: Fentanyl 50 mcg IV and Versed 5 mg IV TOPICAL ANESTHETIC: Cetacaine Spray  DESCRIPTION OF PROCEDURE: After the risks benefits and alternatives of the procedure were thoroughly explained, informed consent was obtained.  The Pentax Gastroscope [id number] endoscope was introduced through the mouth and advanced to the second portion of the duodenum. Without limitations.  The instrument was slowly withdrawn as the mucosa was fully examined.     There was mild, non-specific distal gastritis.  This was biopsied and sent to pathology.  There was a Schatzki's ring at [employer] junction. The examination was otherwise normal.  Retroflexed views revealed no abnormalities.     The scope was then withdrawn from the patient and the procedure completed. COMPLICATIONS: There were no complications.  ENDOSCOPIC IMPRESSION: There was mild, non-specific distal gastritis.  This was biopsied and sent to pathology.  There was a Schatzki's ring at [employer] junction. The examination was otherwise normal.  RECOMMENDATIONS: This examination does not explain her GI bleeding.  Her last colonoscopy as "about 6 years ago" in [place].  Will proceed with colonoscopy tomorrow to continue workup of her bleeding.  The non-IV contrast CT scan yesterday suggested a liver mass.  I will order MRI of liver today for better evaluation.    eSigned:  Milus Banister, MD 05/27/2014 12:09 PM

## 2014-05-27 NOTE — Progress Notes (Signed)
UR Completed Johnae Friley Graves-Bigelow, RN,BSN [phone]  

## 2014-05-27 NOTE — Interval H&P Note (Signed)
History and Physical Interval Note:  05/27/2014 11:54 AM  Brittany Macdonald  has presented today for surgery, with the diagnosis of gi bleed  The various methods of treatment have been discussed with the patient and family. After consideration of risks, benefits and other options for treatment, the patient has consented to  Procedure(s): ESOPHAGOGASTRODUODENOSCOPY (EGD) (N/A) as a surgical intervention .  The patient's history has been reviewed, patient examined, no change in status, stable for surgery.  I have reviewed the patient's chart and labs.  Questions were answered to the patient's satisfaction.     Milus Banister

## 2014-05-27 NOTE — Progress Notes (Signed)
Patient made RN aware that she used to take a BP medication and stopped about two months ago related to finances. BP trends elevated. Md made aware.

## 2014-05-28 ENCOUNTER — Encounter (HOSPITAL_COMMUNITY)
Admission: EM | Disposition: A | Discharge status: Home / Self Care | Payer: Self-pay | Source: Home / Self Care | Attending: Internal Medicine

## 2014-05-28 ENCOUNTER — Observation Stay (HOSPITAL_COMMUNITY): Payer: Medicare Other

## 2014-05-28 ENCOUNTER — Encounter (HOSPITAL_COMMUNITY): Payer: Self-pay | Admitting: *Deleted

## 2014-05-28 DIAGNOSIS — K922 Gastrointestinal hemorrhage, unspecified: Principal | ICD-10-CM

## 2014-05-28 DIAGNOSIS — D126 Benign neoplasm of colon, unspecified: Secondary | ICD-10-CM | POA: Diagnosis not present

## 2014-05-28 DIAGNOSIS — J984 Other disorders of lung: Secondary | ICD-10-CM | POA: Diagnosis not present

## 2014-05-28 DIAGNOSIS — K7689 Other specified diseases of liver: Secondary | ICD-10-CM | POA: Diagnosis not present

## 2014-05-28 DIAGNOSIS — K2990 Gastroduodenitis, unspecified, without bleeding: Secondary | ICD-10-CM | POA: Diagnosis not present

## 2014-05-28 DIAGNOSIS — K921 Melena: Secondary | ICD-10-CM | POA: Diagnosis not present

## 2014-05-28 DIAGNOSIS — K5730 Diverticulosis of large intestine without perforation or abscess without bleeding: Secondary | ICD-10-CM | POA: Diagnosis not present

## 2014-05-28 DIAGNOSIS — N281 Cyst of kidney, acquired: Secondary | ICD-10-CM | POA: Diagnosis not present

## 2014-05-28 DIAGNOSIS — K2970 Gastritis, unspecified, without bleeding: Secondary | ICD-10-CM | POA: Diagnosis not present

## 2014-05-28 DIAGNOSIS — K769 Liver disease, unspecified: Secondary | ICD-10-CM | POA: Diagnosis not present

## 2014-05-28 DIAGNOSIS — K625 Hemorrhage of anus and rectum: Secondary | ICD-10-CM | POA: Diagnosis not present

## 2014-05-28 DIAGNOSIS — Z1211 Encounter for screening for malignant neoplasm of colon: Secondary | ICD-10-CM | POA: Diagnosis not present

## 2014-05-28 HISTORY — PX: COLONOSCOPY: SHX5424

## 2014-05-28 LAB — CBC
HCT: 33.3 % — ABNORMAL LOW (ref 36.0–46.0)
Hemoglobin: 10.9 g/dL — ABNORMAL LOW (ref 12.0–15.0)
MCH: 29.1 pg (ref 26.0–34.0)
MCHC: 32.7 g/dL (ref 30.0–36.0)
MCV: 88.8 fL (ref 78.0–100.0)
Platelets: 251 K/uL (ref 150–400)
RBC: 3.75 MIL/uL — ABNORMAL LOW (ref 3.87–5.11)
RDW: 13.5 % (ref 11.5–15.5)
WBC: 7.7 K/uL (ref 4.0–10.5)

## 2014-05-28 LAB — GLUCOSE, CAPILLARY
Glucose-Capillary: 178 mg/dL — ABNORMAL HIGH (ref 70–99)
Glucose-Capillary: 173 mg/dL — ABNORMAL HIGH (ref 70–99)
Glucose-Capillary: 237 mg/dL — ABNORMAL HIGH (ref 70–99)
Glucose-Capillary: 201 mg/dL — ABNORMAL HIGH (ref 70–99)

## 2014-05-28 SURGERY — COLONOSCOPY
Anesthesia: Moderate Sedation

## 2014-05-28 MED ORDER — SODIUM CHLORIDE 0.9 % IV SOLN: INTRAVENOUS | Status: DC

## 2014-05-28 MED ORDER — MIDAZOLAM HCL 5 MG/5ML IJ SOLN
INTRAMUSCULAR | Status: DC | PRN
  Administered 2014-05-28: 1 mg via INTRAVENOUS
  Administered 2014-05-28 (×2): 2 mg via INTRAVENOUS

## 2014-05-28 MED ORDER — LORAZEPAM 2 MG/ML IJ SOLN
1.0000 mg | Freq: Once | INTRAMUSCULAR | Status: AC
  Administered 2014-05-28: 1 mg via INTRAVENOUS

## 2014-05-28 MED ORDER — GADOBENATE DIMEGLUMINE 529 MG/ML IV SOLN
20.0000 mL | Freq: Once | INTRAVENOUS | Status: AC | PRN
  Administered 2014-05-28: 20 mL via INTRAVENOUS

## 2014-05-28 MED ORDER — LORAZEPAM 2 MG/ML IJ SOLN: 1.0000 mg | INTRAMUSCULAR | Status: DC | PRN

## 2014-05-28 MED ORDER — FENTANYL CITRATE 0.05 MG/ML IJ SOLN
INTRAMUSCULAR | Status: AC
  Filled 2014-05-28: qty 2

## 2014-05-28 MED ORDER — MIDAZOLAM HCL 5 MG/ML IJ SOLN
INTRAMUSCULAR | Status: AC
  Filled 2014-05-28: qty 2

## 2014-05-28 MED ORDER — FENTANYL CITRATE 0.05 MG/ML IJ SOLN
INTRAMUSCULAR | Status: DC | PRN
  Administered 2014-05-28 (×3): 25 ug via INTRAVENOUS

## 2014-05-28 MED ORDER — LORAZEPAM 2 MG/ML IJ SOLN
INTRAMUSCULAR | Status: AC
  Filled 2014-05-28: qty 1

## 2014-05-28 NOTE — ED Provider Notes (Signed)
Medical screening examination/treatment/procedure(s) were performed by non-physician practitioner and as supervising physician I was immediately available for consultation/collaboration.  Philipp Deputy, M.D.  Harden Mo, MD 05/28/14 [phone]

## 2014-05-28 NOTE — Progress Notes (Signed)
[facility] PROGRESS NOTE  Brittany Macdonald XIH:[id number] DOB: [birth year] DOA: 05/26/2014 PCP: No PCP Per Patient  Assessment/Plan: GI Bleed, melena, Hematochezia:  -Appreciate GI assisstance  -no source of bleed found on EGD. Colonoscopy with mult polyps and extensive diverticula - Poor bowel prep with recs for repeat colon in 1 year Anemia  -Hgb remaining stable -jehovah's witness so may need epo or iron if continues to drop  Liver lesion, Mass  -MRI pending  Multiple lung nodule by CT abdomen  -will order CT chest with contrast  Diabetes:  -CBGs slightly elevated  -will resume lantus at decreased dose, SSI  Hypertension  -Has not been taking medications due to financial reason, but now has Medicare  -On hyzaar previously but will avoid with current GIB  -start metoprolol, monitor  Code Status: Full Family Communication: Pt and family at bedside Disposition Plan: Pending   Consultants:  GI  Procedures:  EGD  Colonoscopy  Antibiotics:   (indicate start date, and stop date if known)  HPI/Subjective: No complaints. No acute events noted overnight  Objective: Filed Vitals:   05/28/14 1255 05/28/14 1300 05/28/14 1305 05/28/14 1329  BP: 124/43  122/47 109/68  Pulse: 56 54 55 54  Temp:    97.7 F (36.5 C)  TempSrc:    Oral  Resp: 11 12 12 15   Height:      Weight:      SpO2: 96% 95% 98% 96%    Intake/Output Summary (Last 24 hours) at 05/28/14 1749 Last data filed at 05/28/14 1739  Gross per 24 hour  Intake 1543.5 ml  Output      1 ml  Net 1542.5 ml   Filed Weights   05/26/14 2038 05/27/14 0527 05/28/14 0449  Weight: 265 lb (120.203 kg) 264 lb 8.8 oz (120 kg) 260 lb (117.935 kg)    Exam:   General:  Awake, in nad  Cardiovascular: regular, s1, s2  Respiratory: normal resp effort, no wheezing  Abdomen: soft, nondistended  Musculoskeletal: perfused, no clubbing   Data Reviewed: Basic Metabolic Panel:  Recent Labs Lab 05/26/14 1257  05/26/14 1352 05/27/14 0730  NA 140 138 136*  K 3.8 4.1 3.9  CL 102 100 98  CO2  --  25 24  GLUCOSE 133* 102* 242*  BUN 10 11 9   CREATININE 0.70 0.64 0.60  CALCIUM  --  9.6 8.8   Liver Function Tests:  Recent Labs Lab 05/26/14 1352  AST 26  ALT 17  ALKPHOS 92  BILITOT 0.3  PROT 8.0  ALBUMIN 3.8    Recent Labs Lab 05/26/14 1352  LIPASE 35   No results found for this basename: AMMONIA,  in the last 168 hours CBC:  Recent Labs Lab 05/26/14 1257 05/26/14 1352 05/27/14 0730 05/28/14 0518  WBC  --  5.7 6.0 7.7  NEUTROABS  --  2.8  --   --   HGB 13.3 12.1 10.7* 10.9*  HCT 39.0 37.0 32.3* 33.3*  MCV  --  89.4 87.8 88.8  PLT  --  246 223 251   Cardiac Enzymes: No results found for this basename: CKTOTAL, CKMB, CKMBINDEX, TROPONINI,  in the last 168 hours BNP (last 3 results) No results found for this basename: PROBNP,  in the last 8760 hours CBG:  Recent Labs Lab 05/27/14 1652 05/27/14 2136 05/28/14 0912 05/28/14 1326 05/28/14 1707  GLUCAP 165* 220* 178* 173* 237*    No results found for this or any previous visit (from the past 240 hour(s)).  Studies: No results found.  Scheduled Meds: . insulin aspart  0-9 Units Subcutaneous TID WC  . insulin glargine  10 Units Subcutaneous QHS  . metoprolol tartrate  12.5 mg Oral BID  . pantoprazole  40 mg Oral BID   Continuous Infusions:   Active Problems:   GI bleeding   GI bleed   Unspecified gastritis and gastroduodenitis without mention of hemorrhage   Liver mass   GI (gastrointestinal bleed)  Time spent: 52min  Brittany Macdonald, [facility] Pager [phone]. If 7PM-7AM, please contact night-coverage at [email or web address], password [facility] 05/28/2014, 5:49 PM  LOS: 2 days

## 2014-05-28 NOTE — Op Note (Signed)
[facility] [place], [address]   OPERATIVE PROCEDURE REPORT  PATIENT: Brittany, Macdonald  MR#: [id number] BIRTHDATE: 29-May-1936 GENDER: Female ENDOSCOPIST: Edmonia James, MD ASSISTANT:   Cleda Daub, RN [facility] [place], technician PROCEDURE DATE: 05/28/2014 PRE-PROCEDURE PREPARATION: The patient was prepped with a gallon of [place] the night prior to the procedure. The patient was fasted for 8 hours prior to the procedure.  PRE-PROCEDURE PHYSICAL: Patient has stable vital signs.  Neck is supple.  There is no JVD, thyromegaly or LAD.  Chest clear to auscultation.  S1 and S2 regular.  Abdomen soft, morbidly obese, non-distended, non-tender with NABS. PROCEDURE:     Colonoscopy with hot snare polypectomy x 5. ASA CLASS:     Class III INDICATIONS:     1.  Rectal bleeding. 2.  Iron deficiency anemia 3.Colorectal cancer screening. MEDICATIONS:     Fentanyl 75 mcg  and Versed 5 mg IV.  DESCRIPTION OF PROCEDURE: After the risks, benefits, and alternatives of the procedure were thoroughly explained [including a 10% missed rate of cancer and polyps], informed consent was obtained.  Digital rectal exam was performed.  The [id number])  was introduced through the anus  and advanced to the cecum, which was identified by both the appendix and ileocecal valve , limited by No adverse events experienced.  The quality of the prep was EXTREMELY POOR . Multiple washes were done. Small lesions could be missed. The instrument was then slowly withdrawn as the colon was fully examined.     COLON FINDINGS: Five sessile polyps ranging between 5-96mm in size were found in the descending colon; these were all removed by hot snare x 5-200/20; Extensive pandiverticulosis was noted throughout the entire examined colon. No masses or AVMs were noted.  The appendiceal orifice and the ICV were identified and photographed. Retroflexed views revealed no  abnormalities.  The patient tolerated the procedure without immediate complications. The scope was then withdrawn from the patient and the procedure terminated.  TIME TO CECUM:    12 minutes 10 seconds WITHDRAW TIME:   12 minutes 20 seconds  IMPRESSION:     1) Five sessile polyps ranging between 3-47mm in size were found in the descending colon and were removed by hot snare x 5. 2) Extensive pandiverticulosis with inspissated stool in several of the diverticula. 3) Very poor prep; small lesions could be missed.  RECOMMENDATIONS:     1.  Hold all Aspirin products, and anti-inflammatory medication for 2 weeks. 2.  Continue current medications. 3.  High fiber diet with liberal fluid intake. 4. Colace 100 mg 2 PO BID daily. 5. Miralax 17 gms 2-3 doses per day.  REPEAT EXAM:      In 1 years for a repeat colonoscopy.  If the patient has any abnormal GI symptoms in the interim, she/he have been advised to contact the office as soon as possible for further recommendations.   CPT CODES:     [id number] Colonoscopy with snare polypectomy  DIAGNOSIS CODES:     211.3 Polyps 569.3 Rectal Bleeding V76.51 Colorectal cancer screening 562.10 Diverticula  REFERRED BY:THP  eSigned:  Dr. Edmonia James, MD 05/28/2014 12:52 PM   PATIENT NAME:  Brittany, Macdonald MR#: [id number]

## 2014-05-29 DIAGNOSIS — K922 Gastrointestinal hemorrhage, unspecified: Secondary | ICD-10-CM | POA: Diagnosis not present

## 2014-05-29 DIAGNOSIS — R911 Solitary pulmonary nodule: Secondary | ICD-10-CM

## 2014-05-29 DIAGNOSIS — K769 Liver disease, unspecified: Secondary | ICD-10-CM | POA: Diagnosis not present

## 2014-05-29 DIAGNOSIS — K921 Melena: Secondary | ICD-10-CM | POA: Diagnosis not present

## 2014-05-29 LAB — GLUCOSE, CAPILLARY: Glucose-Capillary: 220 mg/dL — ABNORMAL HIGH (ref 70–99)

## 2014-05-29 MED ORDER — POLYETHYLENE GLYCOL 3350 17 G PO PACK: 17.0000 g | PACK | Freq: Every day | ORAL | Status: DC

## 2014-05-29 MED ORDER — PANTOPRAZOLE SODIUM 40 MG PO TBEC: 40.0000 mg | DELAYED_RELEASE_TABLET | Freq: Two times a day (BID) | ORAL | Status: DC

## 2014-05-29 MED ORDER — DOCUSATE SODIUM 100 MG PO CAPS: 100.0000 mg | ORAL_CAPSULE | Freq: Two times a day (BID) | ORAL | Status: DC

## 2014-05-29 MED ORDER — METOPROLOL TARTRATE 12.5 MG HALF TABLET: 12.5000 mg | ORAL_TABLET | Freq: Two times a day (BID) | ORAL | Status: DC

## 2014-05-29 NOTE — Progress Notes (Signed)
Brittany Macdonald to be D/C'd Home per MD order.  Discussed with the patient and all questions fully answered.    Medication List         acetaminophen 500 MG tablet  Commonly known as:  TYLENOL  Take 250 mg by mouth every 6 (six) hours as needed for moderate pain.     docusate sodium 100 MG capsule  Commonly known as:  COLACE  Take 1 capsule (100 mg total) by mouth 2 (two) times daily.     insulin aspart 100 UNIT/ML injection  Commonly known as:  novoLOG  Inject 25 Units into the skin 3 (three) times daily before meals.     insulin glargine 100 UNIT/ML injection  Commonly known as:  LANTUS  Inject 35 Units into the skin at bedtime.     metoprolol tartrate 12.5 mg Tabs tablet  Commonly known as:  LOPRESSOR  Take 0.5 tablets (12.5 mg total) by mouth 2 (two) times daily.     polyethylene glycol packet  Commonly known as:  MIRALAX / GLYCOLAX  Take 17 g by mouth daily.        VVS, Skin clean, dry and intact without evidence of skin break down, no evidence of skin tears noted. IV catheter discontinued intact. Site without signs and symptoms of complications. Dressing and pressure applied.  An After Visit Summary was printed and given to the patient.  D/c education completed with patient/family including follow up instructions, medication list, d/c activities limitations if indicated, with other d/c instructions as indicated by MD - patient able to verbalize understanding, all questions fully answered.   Patient instructed to return to ED, call 911, or call MD for any changes in condition.   Patient escorted via [place], and D/C home via private auto.  Delman Cheadle 05/29/2014 11:07 AM

## 2014-05-29 NOTE — Discharge Summary (Addendum)
Physician Discharge Summary  Brittany Macdonald KYH:[id number] DOB: January 21, 1936 DOA: 05/26/2014  PCP: No PCP Per Patient: To establish with Brittany Macdonald  Admit date: 05/26/2014 Discharge date: 05/29/2014  Time spent: 35 minutes  Recommendations for Outpatient Follow-up:  1. Establish with PCP in 1 week 2. Recommend repeat CT chest without contrast in 6 months, to w/u lung nodules 3. Follow up MRI of abdomen, focus on liver lesions, may warrant referral to GI as outpatient  Discharge Diagnoses:  Active Problems:   GI bleeding   GI bleed   Unspecified gastritis and gastroduodenitis without mention of hemorrhage   Liver mass   GI (gastrointestinal bleed)  Discharge Condition: Stable  Diet recommendation: Diabetic  Filed Weights   05/27/14 0527 05/28/14 0449 05/29/14 0602  Weight: 264 lb 8.8 oz (120 kg) 260 lb (117.935 kg) 266 lb 1.5 oz (120.7 kg)    History of present illness:  Please see h and p from 7/9 for details. Briefly, pt presents to hospital with black tarry stools and mild anemia. GI was consulted and the hospitalist service consulted for admission.  Hospital Course:  GI Bleed, melena, Hematochezia:  -Appreciated GI assisstance  -No source of bleed found on EGD. Colonoscopy with mult polyps and extensive diverticula  - Unfortunately the bowel prep was poor, with GI recommendations for repeat colon in 1 year  Anemia  -Hgb remained stable  Liver lesion, Mass  -MRI was done, results are pending at the time of this dictation. -Initial CT abd did reveal a 5cm mass involving the R hepatic lobe just superior to the gallbladder. -Will have pt's new PCP follow up on these results and recommend appropriate referral at that time Multiple lung nodule by CT abdomen  -CT chest revealed multiple B pulm nodules with recommendations for repeat CT chest in 6 months Diabetes:  -CBGs slightly elevated  -will resume lantus at decreased dose, SSI  Hypertension  -Has not been taking  medications due to financial reason, but now has Medicare  -On hyzaar previously but will avoid with current GIB  -started metoprolol  Procedures: EGD, colonoscopy, CT chest, MRI abd  Consultations:  GI  Discharge Exam: Filed Vitals:   05/28/14 1329 05/28/14 2232 05/29/14 0602 05/29/14 0943  BP: 109/68 150/76 159/77 114/74  Pulse: 54 58 69 79  Temp: 97.7 F (36.5 C) 98.2 F (36.8 C) 98.4 F (36.9 C)   TempSrc: Oral Oral Oral   Resp: 15 16 16    Height:      Weight:   266 lb 1.5 oz (120.7 kg)   SpO2: 96% 98% 96%     General: Awake, in nad Cardiovascular: regular, s1, s2 Respiratory: normal resp effort, no wheezing  Discharge Instructions     Medication List         acetaminophen 500 MG tablet  Commonly known as:  TYLENOL  Take 250 mg by mouth every 6 (six) hours as needed for moderate pain.     docusate sodium 100 MG capsule  Commonly known as:  COLACE  Take 1 capsule (100 mg total) by mouth 2 (two) times daily.     insulin aspart 100 UNIT/ML injection  Commonly known as:  novoLOG  Inject 25 Units into the skin 3 (three) times daily before meals.     insulin glargine 100 UNIT/ML injection  Commonly known as:  LANTUS  Inject 35 Units into the skin at bedtime.     metoprolol tartrate 12.5 mg Tabs tablet  Commonly known as:  LOPRESSOR  Take 0.5 tablets (12.5 mg total) by mouth 2 (two) times daily.     polyethylene glycol packet  Commonly known as:  MIRALAX / GLYCOLAX  Take 17 g by mouth daily.       Allergies  Allergen Reactions  . Penicillins Anaphylaxis  . Ivp Dye [Iodinated Diagnostic Agents] Other (See Comments)    Goes into shock   Follow-up Information   Follow up with Brittany Pepper, MD On 06/03/2014. (10 am, bing insurance card, and photo id, arrive 15 minutes early)    Specialty:  Family Medicine   Contact information:   [place] [address] [phone]        The results of significant diagnostics  from this hospitalization (including imaging, microbiology, ancillary and laboratory) are listed below for reference.    Significant Diagnostic Studies: Ct Abdomen Pelvis Wo Contrast  05/26/2014   CLINICAL DATA:  GI bleed.  Pain.  EXAM: CT ABDOMEN AND PELVIS WITHOUT CONTRAST  TECHNIQUE: Multidetector CT imaging of the abdomen and pelvis was performed following the standard protocol without IV contrast.  COMPARISON:  None.  FINDINGS: Ill-defined lucency measuring approximately 5.1 cm is noted in the right hepatic lobe just superior to the gallbladder. This could be infectious or malignant. Small adjacent simple cysts are present. The adjacent gallbladder is nondistended. Gallbladder wall thickness appears normal. No gallstones are noted by CT. No pericholecystic fluid collections noted. No biliary distention. The pancreas is unremarkable. Spleen is unremarkable.  Adrenals normal. Kidneys normal. No hydronephrosis or obstructing ureteral stone. Bladder is nondistended.  No significant adenopathy. Abdominal aorta normal caliber. Atherosclerotic vascular changes noted of the abdominal aorta.  Appendiceal region is normal. There is no bowel distention. No free air. No mesenteric mass. The stomach is nondistended. Small sliding hiatal hernia. Tiny umbilical hernia with herniation of fat only.  Coronary artery disease. Multiple nonspecific pulmonary nodules. Calcification of the left lower lobe nodule. These tiny nonspecific nodules are most likely granulomas. A a baseline nonenhanced chest CT can be obtained to further evaluate. Diffuse thoracolumbar degenerative change. Bilateral hip replacements.  IMPRESSION: 1. Ill-defined hepatic lesion measuring approximately 5.1 cm in the right hepatic lobe just superior to the gallbladder. Differential diagnosis includes infectious or malignant etiologies. The adjacent gallbladder appears unremarkable by CT. No biliary distention. 2. Coronary artery disease. 3. Multiple  nonspecific pulmonary nodules. Calcification noted in a left lower lobe nodule. These tiny nonspecific nodules are most likely granulomas. A baseline nonenhanced chest CT can be obtained to further evaluate.   Electronically Signed   By: Marcello Moores  Register   On: 05/26/2014 16:54   Ct Chest Wo Contrast  05/28/2014   CLINICAL DATA:  Lung nodules seen on the imaged portion of the lung bases on recent CT abdomen and pelvis without contrast 05/26/2014  EXAM: CT CHEST WITHOUT CONTRAST  TECHNIQUE: Multidetector CT imaging of the chest was performed following the standard protocol without IV contrast.  COMPARISON:  CT abdomen pelvis 05/26/2014  FINDINGS: There is moderate elevation of the right hemidiaphragm. Mild cardiomegaly. Moderately heavy atherosclerotic calcification of the normal caliber thoracic aorta. Negative for supraclavicular, mediastinal, hilar, or axillary lymphadenopathy. Negative for pleural or pericardial effusion.  There is streaky linear atelectasis and/or scarring in the lingula and the right lower lobe.  There are multiple small pulmonary nodules: 5 mm pulmonary nodule in the right lower lobe, along the major fissure on image number 32 of the lung windows. 6 mm pulmonary nodule right lower lobe  on image number 34. Subtle 4 mm nodule right upper lobe near the apex on image number 10. 4 mm right middle lobe nodule image number 37. 6 mm nodule left upper lobe on image number 28. 3 mm left lower lobe pulmonary nodule on image number 50.  Trachea mainstem bronchi are patent. Negative for pneumothorax. Typical degenerative discuss that prominent anterior osteophyte formation at multiple levels in the thoracic spine. Vertebral bodies are normal in height and alignment.  The imaged portion of the abdomen demonstrates some circumscribed low-density lesions in the liver, the largest measuring 1.9 mm, that likely reflects hepatic cysts. Chest cephalad to the gallbladder fossa is an irregular hypodense mass as  previously described on the CT abdomen of 05/26/2014. This is associated with capsular retraction and measures approximately 5 cm. Similar-appearing irregular hypodensity is seen in the medial segment of the left lobe of the liver.  IMPRESSION: 1. Multiple bilateral small pulmonary nodules. Largest pulmonary nodule is 5 mm. If the patient is at high risk for bronchogenic carcinoma, follow-up chest CT at 6-12 months is recommended. If the patient is at low risk for bronchogenic carcinoma, follow-up chest CT at 12 months is recommended. This recommendation follows the consensus statement: Guidelines for Management of Small Pulmonary Nodules Detected on CT Scans: A Statement from the [place] as published in Radiology 2005;237:395-400. 2. Ill-defined hepatic hypodense masses, involving the left and right hepatic lobes. Consider further evaluation with MRI of the abdomen with without contrast, as an outpatient study, when the patient can tolerate breath holding.  3.  Mild cardiomegaly.   Electronically Signed   By: Curlene Dolphin M.D.   On: 05/28/2014 21:59    Microbiology: No results found for this or any previous visit (from the past 240 hour(s)).   Labs: Basic Metabolic Panel:  Recent Labs Lab 05/26/14 1257 05/26/14 1352 05/27/14 0730  NA 140 138 136*  K 3.8 4.1 3.9  CL 102 100 98  CO2  --  25 24  GLUCOSE 133* 102* 242*  BUN 10 11 9   CREATININE 0.70 0.64 0.60  CALCIUM  --  9.6 8.8   Liver Function Tests:  Recent Labs Lab 05/26/14 1352  AST 26  ALT 17  ALKPHOS 92  BILITOT 0.3  PROT 8.0  ALBUMIN 3.8    Recent Labs Lab 05/26/14 1352  LIPASE 35   No results found for this basename: AMMONIA,  in the last 168 hours CBC:  Recent Labs Lab 05/26/14 1257 05/26/14 1352 05/27/14 0730 05/28/14 0518  WBC  --  5.7 6.0 7.7  NEUTROABS  --  2.8  --   --   HGB 13.3 12.1 10.7* 10.9*  HCT 39.0 37.0 32.3* 33.3*  MCV  --  89.4 87.8 88.8  PLT  --  246 223 251   Cardiac  Enzymes: No results found for this basename: CKTOTAL, CKMB, CKMBINDEX, TROPONINI,  in the last 168 hours BNP: BNP (last 3 results) No results found for this basename: PROBNP,  in the last 8760 hours CBG:  Recent Labs Lab 05/28/14 0912 05/28/14 1326 05/28/14 1707 05/28/14 2230 05/29/14 0727  GLUCAP 178* 173* 237* 201* 220*       Signed:  CHIU, STEPHEN K  [facility] 05/29/2014, 10:26 AM

## 2014-05-30 ENCOUNTER — Encounter (HOSPITAL_COMMUNITY): Payer: Self-pay | Admitting: Gastroenterology

## 2014-06-03 DIAGNOSIS — M79609 Pain in unspecified limb: Secondary | ICD-10-CM | POA: Diagnosis not present

## 2014-06-03 DIAGNOSIS — E119 Type 2 diabetes mellitus without complications: Secondary | ICD-10-CM | POA: Diagnosis not present

## 2014-06-03 DIAGNOSIS — Z09 Encounter for follow-up examination after completed treatment for conditions other than malignant neoplasm: Secondary | ICD-10-CM | POA: Diagnosis not present

## 2014-06-03 DIAGNOSIS — M1990 Unspecified osteoarthritis, unspecified site: Secondary | ICD-10-CM | POA: Diagnosis not present

## 2014-06-03 DIAGNOSIS — I10 Essential (primary) hypertension: Secondary | ICD-10-CM | POA: Diagnosis not present

## 2014-06-03 DIAGNOSIS — K922 Gastrointestinal hemorrhage, unspecified: Secondary | ICD-10-CM | POA: Diagnosis not present

## 2014-06-03 DIAGNOSIS — J984 Other disorders of lung: Secondary | ICD-10-CM | POA: Diagnosis not present

## 2014-06-03 DIAGNOSIS — M2550 Pain in unspecified joint: Secondary | ICD-10-CM | POA: Diagnosis not present

## 2014-06-07 ENCOUNTER — Telehealth: Payer: Self-pay | Admitting: Gastroenterology

## 2014-06-07 NOTE — Telephone Encounter (Signed)
Brittany Macdonald, please call her. Biopsies from stomach last week during EGD showed no sign of infection. Also her MRI report shows probable focal fatty liver changes, no masses. (Radiologist recommended no follow up since she has no known malignancy)  Pt returned call ans is aware of the above

## 2014-06-21 ENCOUNTER — Ambulatory Visit: Payer: Medicare Other | Attending: Family Medicine

## 2014-06-21 DIAGNOSIS — I10 Essential (primary) hypertension: Secondary | ICD-10-CM | POA: Diagnosis not present

## 2014-06-21 DIAGNOSIS — M25569 Pain in unspecified knee: Secondary | ICD-10-CM | POA: Insufficient documentation

## 2014-06-21 DIAGNOSIS — R5381 Other malaise: Secondary | ICD-10-CM | POA: Diagnosis not present

## 2014-06-21 DIAGNOSIS — Z96649 Presence of unspecified artificial hip joint: Secondary | ICD-10-CM | POA: Insufficient documentation

## 2014-06-21 DIAGNOSIS — E119 Type 2 diabetes mellitus without complications: Secondary | ICD-10-CM | POA: Diagnosis not present

## 2014-06-21 DIAGNOSIS — R269 Unspecified abnormalities of gait and mobility: Secondary | ICD-10-CM | POA: Insufficient documentation

## 2014-06-21 DIAGNOSIS — IMO0001 Reserved for inherently not codable concepts without codable children: Secondary | ICD-10-CM | POA: Insufficient documentation

## 2014-06-21 DIAGNOSIS — M79609 Pain in unspecified limb: Secondary | ICD-10-CM | POA: Insufficient documentation

## 2014-06-24 ENCOUNTER — Ambulatory Visit: Payer: Medicare Other | Admitting: Physical Therapy

## 2014-07-12 ENCOUNTER — Inpatient Hospital Stay (HOSPITAL_COMMUNITY)
Admission: EM | Admit: 2014-07-12 | Discharge: 2014-07-15 | DRG: 378 | Disposition: A | Discharge status: Home Health | Payer: Medicare Other | Attending: Internal Medicine | Admitting: Internal Medicine

## 2014-07-12 ENCOUNTER — Encounter (HOSPITAL_COMMUNITY): Payer: Self-pay | Admitting: Emergency Medicine

## 2014-07-12 ENCOUNTER — Inpatient Hospital Stay (HOSPITAL_COMMUNITY): Payer: Medicare Other

## 2014-07-12 DIAGNOSIS — Z531 Procedure and treatment not carried out because of patient's decision for reasons of belief and group pressure: Secondary | ICD-10-CM | POA: Diagnosis not present

## 2014-07-12 DIAGNOSIS — M1990 Unspecified osteoarthritis, unspecified site: Secondary | ICD-10-CM | POA: Diagnosis present

## 2014-07-12 DIAGNOSIS — Z6841 Body Mass Index (BMI) 40.0 and over, adult: Secondary | ICD-10-CM

## 2014-07-12 DIAGNOSIS — IMO0001 Reserved for inherently not codable concepts without codable children: Secondary | ICD-10-CM | POA: Diagnosis present

## 2014-07-12 DIAGNOSIS — R197 Diarrhea, unspecified: Secondary | ICD-10-CM | POA: Diagnosis present

## 2014-07-12 DIAGNOSIS — Z794 Long term (current) use of insulin: Secondary | ICD-10-CM

## 2014-07-12 DIAGNOSIS — E119 Type 2 diabetes mellitus without complications: Secondary | ICD-10-CM | POA: Diagnosis not present

## 2014-07-12 DIAGNOSIS — K7689 Other specified diseases of liver: Secondary | ICD-10-CM | POA: Diagnosis present

## 2014-07-12 DIAGNOSIS — E669 Obesity, unspecified: Secondary | ICD-10-CM | POA: Diagnosis present

## 2014-07-12 DIAGNOSIS — K5790 Diverticulosis of intestine, part unspecified, without perforation or abscess without bleeding: Secondary | ICD-10-CM | POA: Diagnosis present

## 2014-07-12 DIAGNOSIS — D62 Acute posthemorrhagic anemia: Secondary | ICD-10-CM | POA: Diagnosis present

## 2014-07-12 DIAGNOSIS — K2990 Gastroduodenitis, unspecified, without bleeding: Secondary | ICD-10-CM

## 2014-07-12 DIAGNOSIS — K2970 Gastritis, unspecified, without bleeding: Secondary | ICD-10-CM

## 2014-07-12 DIAGNOSIS — K922 Gastrointestinal hemorrhage, unspecified: Secondary | ICD-10-CM | POA: Diagnosis not present

## 2014-07-12 DIAGNOSIS — E1165 Type 2 diabetes mellitus with hyperglycemia: Secondary | ICD-10-CM

## 2014-07-12 DIAGNOSIS — Z96649 Presence of unspecified artificial hip joint: Secondary | ICD-10-CM | POA: Diagnosis not present

## 2014-07-12 DIAGNOSIS — K921 Melena: Secondary | ICD-10-CM | POA: Diagnosis present

## 2014-07-12 DIAGNOSIS — R160 Hepatomegaly, not elsewhere classified: Secondary | ICD-10-CM

## 2014-07-12 DIAGNOSIS — M542 Cervicalgia: Secondary | ICD-10-CM | POA: Diagnosis present

## 2014-07-12 DIAGNOSIS — G8929 Other chronic pain: Secondary | ICD-10-CM | POA: Diagnosis present

## 2014-07-12 DIAGNOSIS — K5731 Diverticulosis of large intestine without perforation or abscess with bleeding: Secondary | ICD-10-CM | POA: Diagnosis present

## 2014-07-12 DIAGNOSIS — K5791 Diverticulosis of intestine, part unspecified, without perforation or abscess with bleeding: Secondary | ICD-10-CM

## 2014-07-12 DIAGNOSIS — I10 Essential (primary) hypertension: Secondary | ICD-10-CM | POA: Diagnosis present

## 2014-07-12 DIAGNOSIS — M069 Rheumatoid arthritis, unspecified: Secondary | ICD-10-CM | POA: Diagnosis present

## 2014-07-12 DIAGNOSIS — K2961 Other gastritis with bleeding: Secondary | ICD-10-CM

## 2014-07-12 HISTORY — DX: Gastro-esophageal reflux disease without esophagitis: K21.9

## 2014-07-12 HISTORY — DX: Gastrointestinal hemorrhage, unspecified: K92.2

## 2014-07-12 HISTORY — DX: Other specified health status: Z78.9

## 2014-07-12 LAB — GLUCOSE, CAPILLARY
GLUCOSE-CAPILLARY: 156 mg/dL — AB (ref 70–99)
Glucose-Capillary: 155 mg/dL — ABNORMAL HIGH (ref 70–99)

## 2014-07-12 LAB — GI PATHOGEN PANEL BY PCR, STOOL
C difficile toxin A/B: NEGATIVE
Campylobacter by PCR: NEGATIVE
Cryptosporidium by PCR: NEGATIVE
E COLI (STEC): NEGATIVE
E coli (ETEC) LT/ST: NEGATIVE
E coli 0157 by PCR: NEGATIVE
G LAMBLIA BY PCR: NEGATIVE
Norovirus GI/GII: NEGATIVE
ROTAVIRUS A BY PCR: NEGATIVE
SALMONELLA BY PCR: NEGATIVE
SHIGELLA BY PCR: NEGATIVE

## 2014-07-12 LAB — CBC
HCT: 22.7 % — ABNORMAL LOW (ref 36.0–46.0)
HEMATOCRIT: 26.8 % — AB (ref 36.0–46.0)
HEMOGLOBIN: 7.6 g/dL — AB (ref 12.0–15.0)
Hemoglobin: 9 g/dL — ABNORMAL LOW (ref 12.0–15.0)
MCH: 29.2 pg (ref 26.0–34.0)
MCH: 28.9 pg (ref 26.0–34.0)
MCHC: 33.6 g/dL (ref 30.0–36.0)
MCHC: 33.5 g/dL (ref 30.0–36.0)
MCV: 87 fL (ref 78.0–100.0)
MCV: 86.3 fL (ref 78.0–100.0)
Platelets: 289 10*3/uL (ref 150–400)
Platelets: 216 10*3/uL (ref 150–400)
RBC: 3.08 MIL/uL — AB (ref 3.87–5.11)
RBC: 2.63 MIL/uL — AB (ref 3.87–5.11)
RDW: 13.3 % (ref 11.5–15.5)
RDW: 13.4 % (ref 11.5–15.5)
WBC: 9.3 10*3/uL (ref 4.0–10.5)
WBC: 7.5 10*3/uL (ref 4.0–10.5)

## 2014-07-12 LAB — COMPREHENSIVE METABOLIC PANEL
ALT: 14 U/L (ref 0–35)
ALT: 12 U/L (ref 0–35)
AST: 19 U/L (ref 0–37)
AST: 19 U/L (ref 0–37)
Albumin: 3.4 g/dL — ABNORMAL LOW (ref 3.5–5.2)
Albumin: 3 g/dL — ABNORMAL LOW (ref 3.5–5.2)
Alkaline Phosphatase: 78 U/L (ref 39–117)
Alkaline Phosphatase: 67 U/L (ref 39–117)
Anion gap: 15 (ref 5–15)
Anion gap: 13 (ref 5–15)
BUN: 14 mg/dL (ref 6–23)
BUN: 12 mg/dL (ref 6–23)
CALCIUM: 8.6 mg/dL (ref 8.4–10.5)
CHLORIDE: 99 meq/L (ref 96–112)
CO2: 23 meq/L (ref 19–32)
CO2: 22 mEq/L (ref 19–32)
CREATININE: 0.72 mg/dL (ref 0.50–1.10)
CREATININE: 0.66 mg/dL (ref 0.50–1.10)
Calcium: 8.9 mg/dL (ref 8.4–10.5)
Chloride: 100 mEq/L (ref 96–112)
GFR calc Af Amer: >90 mL/min (ref >90)
GFR calc Af Amer: >90 mL/min (ref >90)
GFR calc non Af Amer: 84 mL/min — ABNORMAL LOW (ref >90)
GFR, EST NON AFRICAN AMERICAN: 82 mL/min — AB (ref >90)
GLUCOSE: 317 mg/dL — AB (ref 70–99)
Glucose, Bld: 330 mg/dL — ABNORMAL HIGH (ref 70–99)
Potassium: 4.3 mEq/L (ref 3.7–5.3)
Potassium: 4.6 mEq/L (ref 3.7–5.3)
SODIUM: 135 meq/L — AB (ref 137–147)
Sodium: 137 mEq/L (ref 137–147)
TOTAL PROTEIN: 6.1 g/dL (ref 6.0–8.3)
Total Bilirubin: <0.2 mg/dL — ABNORMAL LOW (ref 0.3–1.2)
Total Protein: 6.9 g/dL (ref 6.0–8.3)

## 2014-07-12 LAB — CBG MONITORING, ED
Glucose-Capillary: 274 mg/dL — ABNORMAL HIGH (ref 70–99)
Glucose-Capillary: 196 mg/dL — ABNORMAL HIGH (ref 70–99)

## 2014-07-12 LAB — RETICULOCYTES
RBC.: 2.6 MIL/uL — AB (ref 3.87–5.11)
RBC.: 2.61 MIL/uL — ABNORMAL LOW (ref 3.87–5.11)
Retic Count, Absolute: 57.2 10*3/uL (ref 19.0–186.0)
Retic Count, Absolute: 70.5 10*3/uL (ref 19.0–186.0)
Retic Ct Pct: 2.2 % (ref 0.4–3.1)
Retic Ct Pct: 2.7 % (ref 0.4–3.1)

## 2014-07-12 LAB — CLOSTRIDIUM DIFFICILE BY PCR: Toxigenic C. Difficile by PCR: NEGATIVE

## 2014-07-12 LAB — HEMOGLOBIN A1C
Hgb A1c MFr Bld: 9.6 % — ABNORMAL HIGH (ref <5.7)
MEAN PLASMA GLUCOSE: 229 mg/dL — AB (ref <117)

## 2014-07-12 LAB — APTT: APTT: 20 s — AB (ref 24–37)

## 2014-07-12 LAB — PROTIME-INR
INR: 1.16 (ref 0.00–1.49)
Prothrombin Time: 14.8 seconds (ref 11.6–15.2)

## 2014-07-12 LAB — NO BLOOD PRODUCTS

## 2014-07-12 LAB — FERRITIN: FERRITIN: 40 ng/mL (ref 10–291)

## 2014-07-12 LAB — IRON AND TIBC
Iron: 39 ug/dL — ABNORMAL LOW (ref 42–135)
SATURATION RATIOS: 15 % — AB (ref 20–55)
TIBC: 266 ug/dL (ref 250–470)
UIBC: 227 ug/dL (ref 125–400)

## 2014-07-12 LAB — VITAMIN B12: Vitamin B-12: 450 pg/mL (ref 211–911)

## 2014-07-12 LAB — FOLATE: Folate: 9.6 ng/mL

## 2014-07-12 MED ORDER — DARBEPOETIN ALFA-POLYSORBATE 60 MCG/0.3ML IJ SOLN
60.0000 ug | Freq: Once | INTRAMUSCULAR | Status: AC
  Administered 2014-07-12: 60 ug via SUBCUTANEOUS
  Filled 2014-07-12: qty 0.3

## 2014-07-12 MED ORDER — ACETAMINOPHEN 325 MG PO TABS: 650.0000 mg | ORAL_TABLET | Freq: Four times a day (QID) | ORAL | Status: DC | PRN

## 2014-07-12 MED ORDER — SODIUM CHLORIDE 0.9 % IV BOLUS (SEPSIS)
1000.0000 mL | Freq: Once | INTRAVENOUS | Status: AC
  Administered 2014-07-12: 1000 mL via INTRAVENOUS

## 2014-07-12 MED ORDER — PANTOPRAZOLE SODIUM 40 MG IV SOLR
40.0000 mg | Freq: Two times a day (BID) | INTRAVENOUS | Status: DC
  Administered 2014-07-12 – 2014-07-15 (×7): 40 mg via INTRAVENOUS
  Filled 2014-07-12 (×8): qty 40

## 2014-07-12 MED ORDER — INSULIN ASPART 100 UNIT/ML ~~LOC~~ SOLN
3.0000 [IU] | Freq: Three times a day (TID) | SUBCUTANEOUS | Status: DC
  Administered 2014-07-12 – 2014-07-15 (×9): 3 [IU] via SUBCUTANEOUS
  Filled 2014-07-12 (×2): qty 1

## 2014-07-12 MED ORDER — ACETAMINOPHEN 650 MG RE SUPP: 650.0000 mg | Freq: Four times a day (QID) | RECTAL | Status: DC | PRN

## 2014-07-12 MED ORDER — INSULIN ASPART 100 UNIT/ML ~~LOC~~ SOLN: 0.0000 [IU] | Freq: Every day | SUBCUTANEOUS | Status: DC

## 2014-07-12 MED ORDER — INSULIN ASPART 100 UNIT/ML ~~LOC~~ SOLN
0.0000 [IU] | Freq: Three times a day (TID) | SUBCUTANEOUS | Status: DC
  Administered 2014-07-12 (×2): 3 [IU] via SUBCUTANEOUS
  Administered 2014-07-12: 8 [IU] via SUBCUTANEOUS
  Administered 2014-07-13: 3 [IU] via SUBCUTANEOUS
  Administered 2014-07-13 (×2): 2 [IU] via SUBCUTANEOUS
  Administered 2014-07-14 – 2014-07-15 (×3): 3 [IU] via SUBCUTANEOUS
  Filled 2014-07-12 (×2): qty 1

## 2014-07-12 MED ORDER — ONDANSETRON HCL 4 MG PO TABS: 4.0000 mg | ORAL_TABLET | Freq: Four times a day (QID) | ORAL | Status: DC | PRN

## 2014-07-12 MED ORDER — ONDANSETRON HCL 4 MG/2ML IJ SOLN: 4.0000 mg | Freq: Four times a day (QID) | INTRAMUSCULAR | Status: DC | PRN

## 2014-07-12 MED ORDER — INSULIN GLARGINE 100 UNIT/ML ~~LOC~~ SOLN
20.0000 [IU] | Freq: Every day | SUBCUTANEOUS | Status: DC
  Administered 2014-07-12 – 2014-07-14 (×3): 20 [IU] via SUBCUTANEOUS
  Filled 2014-07-12 (×4): qty 0.2

## 2014-07-12 MED ORDER — SODIUM CHLORIDE 0.9 % IJ SOLN
3.0000 mL | Freq: Two times a day (BID) | INTRAMUSCULAR | Status: DC
  Administered 2014-07-12 – 2014-07-15 (×7): 3 mL via INTRAVENOUS

## 2014-07-12 MED ORDER — INSULIN ASPART 100 UNIT/ML ~~LOC~~ SOLN: 0.0000 [IU] | Freq: Four times a day (QID) | SUBCUTANEOUS | Status: DC | PRN

## 2014-07-12 MED ORDER — SODIUM CHLORIDE 0.9 % IV SOLN
1020.0000 mg | Freq: Once | INTRAVENOUS | Status: AC
  Administered 2014-07-12: 1020 mg via INTRAVENOUS
  Filled 2014-07-12: qty 34

## 2014-07-12 NOTE — ED Notes (Signed)
Pt denies pain or nausea. Alert, NAD, calm, interactive.

## 2014-07-12 NOTE — ED Notes (Signed)
No changes. Admitting MD Dr. Posey Pronto at [facility], [place] ordered.

## 2014-07-12 NOTE — Progress Notes (Addendum)
[facility] PROGRESS NOTE Interim History: 78 y.o. female with Past medical history of diabetes mellitus, hypertension, arthritis, fibromyalgia, diverticulosis, recent GI bleed. Recent colonoscopy no source of bleeding, some diverticula and polyps removed.    Assessment/Plan: Acute *GI bleed most likely due to Diverticulosis.iron deficiency anemia: - Jehovah witness and cannot received blood. - she agreed to aranesp and IV iron. - CBC q12 hrs. Check anemia panel, for ferritin. - IV Ferriheme 1020 and Aranesp 60 mcg Sq. - May repeat Aranesp in 1 week. - pt is symptomatic with ambulation, we have d/w her risk and benefits.  Diarrhea: - due to Gi bleed. C.dif pending.   Osteoarthritis - tylenol.  Hypertension - pharmacy to due med rec.  Diabetes mellitus without complication - cont insulin plus SSI.      Code Status: Full  Disposition: Admitted toinpatient in telemetry unit. Disposition Plan: inpatinet   Consultants:  none  Procedures:  abd x-ray  Antibiotics:  None  HPI/Subjective: No complains, she wants to eat.  Objective: Filed Vitals:   07/12/14 0615 07/12/14 0630 07/12/14 0645 07/12/14 0700  BP: 152/94 116/37 115/72 129/62  Pulse: 127 93 97 87  Temp:      TempSrc:      Resp: 13 15 19 17   Height:      Weight:      SpO2: 100% 98% 100% 99%    Intake/Output Summary (Last 24 hours) at 07/12/14 0726 Last data filed at 07/12/14 0713  Gross per 24 hour  Intake   1000 ml  Output      1 ml  Net    999 ml   Filed Weights   07/12/14 0053  Weight: 121.11 kg (267 lb)    Exam:  General: Alert, awake, oriented x3, in no acute distress.  HEENT: No bruits, no goiter.  Heart: Regular rate and rhythm. Lungs: Good air movement, clear Abdomen: Soft, nontender, nondistended, positive bowel sounds.    Data Reviewed: Basic Metabolic Panel:  Recent Labs Lab 07/12/14 0115 07/12/14 0520  NA 137 135*  K 4.3 4.6  CL 99 100  CO2 23 22    GLUCOSE 330* 317*  BUN 14 12  CREATININE 0.72 0.66  CALCIUM 8.9 8.6   Liver Function Tests:  Recent Labs Lab 07/12/14 0115 07/12/14 0520  AST 19 19  ALT 14 12  ALKPHOS 78 67  BILITOT <0.2* <0.2*  PROT 6.9 6.1  ALBUMIN 3.4* 3.0*   No results found for this basename: LIPASE, AMYLASE,  in the last 168 hours No results found for this basename: AMMONIA,  in the last 168 hours CBC:  Recent Labs Lab 07/12/14 0115 07/12/14 0520  WBC 9.3 7.5  HGB 9.0* 7.6*  HCT 26.8* 22.7*  MCV 87.0 86.3  PLT 289 216   Cardiac Enzymes: No results found for this basename: CKTOTAL, CKMB, CKMBINDEX, TROPONINI,  in the last 168 hours BNP (last 3 results) No results found for this basename: PROBNP,  in the last 8760 hours CBG: No results found for this basename: GLUCAP,  in the last 168 hours  No results found for this or any previous visit (from the past 240 hour(s)).   Studies: Dg Abd 1 View  07/12/2014   CLINICAL DATA:  Rectal bleeding and lower abdominal pain for a couple of days.  EXAM: ABDOMEN - 1 VIEW  COMPARISON:  CT abdomen and pelvis 05/26/2014. MRI abdomen 05/27/2014.  FINDINGS: Gas and stool throughout the colon. No small or large bowel distention. No  radiopaque stones. Degenerative changes throughout the lumbar spine. Previous bilateral hip arthroplasties.  IMPRESSION: Nonobstructive bowel gas pattern.   Electronically Signed   By: Lucienne Capers M.D.   On: 07/12/2014 04:54    Scheduled Meds: . pantoprazole (PROTONIX) IV  40 mg Intravenous Q12H  . sodium chloride  3 mL Intravenous Q12H   Continuous Infusions:    Charlynne Cousins  [facility] Pager [phone]. If 8PM-8AM, please contact night-coverage at [email or web address], password [facility] 07/12/2014, 7:26 AM  LOS: 0 days      **Disclaimer: This note may have been dictated with voice recognition software. Similar sounding words can inadvertently be transcribed and this note may contain transcription errors which may not  have been corrected upon publication of note.**

## 2014-07-12 NOTE — Progress Notes (Addendum)
Pt transferred to room 3E01 from the ED via stretcher. Pt alert and oriented x 4. Denies pain or concerns. Oriented pt to unit and room and placed phone and call bell in reach. Assessment completed. Will continue to monitor pt closely. Report received from Judson Roch, RN via telephone.  Eulis Canner, RN

## 2014-07-12 NOTE — ED Notes (Signed)
Pt is Jehovah's Witness. Refusal to consent for blood signed. Copy of refusal in chart, at [facility] and sent to blood bank.  Melena noted in [facility].  Pt admits to mild sob, dizziness, diaphoresis and weakness, worse upon arrival to ED, "better now", skin W&D at this time, LS CTA, VSS, conjunctiva pale.

## 2014-07-12 NOTE — ED Notes (Signed)
Clear liquid diet tray ordered for patient

## 2014-07-12 NOTE — H&P (Signed)
[facility] History and Physical  Patient: Brittany Macdonald  PNT:[id number]  DOB: 06-24-1936  DOS: the patient was seen and examined on 07/12/2014 PCP: No PCP Per Patient  Chief Complaint: Abdominal pain, diarrhea, GI bleed  HPI: Brittany Macdonald is a 78 y.o. female with Past medical history of diabetes mellitus, hypertension, arthritis, fibromyalgia, diverticulosis, recent GI bleed. The patient is presenting with complaints of diarrhea along with blood in the bowel. She mentions that she was at her baseline until 2 days ago she started having episodes of abdominal cramps this was followed by diarrhea with bright red blood and dark color blocks. The cramps has resolved pretty much at the time of my evaluation. She did have 4-5 episodes of those walk watery bowel movement she was which were bright red. She denies any fever or chills. She denies any chest pain or shortness of breath. She complains of some dizziness. Denies any vertigo or focal deficit. Denies any recent change in her medication. Denies taking any over-the-counter NSAIds  The patient is coming from home And at her baseline independent for most of her ADL.  Review of Systems: as mentioned in the history of present illness.  A Comprehensive review of the other systems is negative.  Past Medical History  Diagnosis Date  . Diabetes mellitus without complication   . Hypertension   . Rheumatoid arthritis(714.0)   . Osteoarthritis   . Fibromyalgia   . Obesity   . Chronic neck pain    Past Surgical History  Procedure Laterality Date  . Joint replacement      bilat hips  . Tubal ligation    . Esophagogastroduodenoscopy N/A 05/27/2014    Procedure: ESOPHAGOGASTRODUODENOSCOPY (EGD);  Surgeon: Milus Banister, MD;  Location: [place];  Service: Endoscopy;  Laterality: N/A;  . Colonoscopy N/A 05/28/2014    Procedure: COLONOSCOPY;  Surgeon: Juanita Craver, MD;  Location: [facility] ENDOSCOPY;  Service: Endoscopy;  Laterality: N/A;   . Total hip arthroplasty Bilateral    Social History:  reports that she has never smoked. She does not have any smokeless tobacco history on file. She reports that she does not drink alcohol or use illicit drugs.  Allergies  Allergen Reactions  . Penicillins Anaphylaxis  . Ivp Dye [Iodinated Diagnostic Agents] Other (See Comments)    Goes into shock  . Metoprolol Other (See Comments)    Headache     History reviewed. No pertinent family history.  Prior to Admission medications   Medication Sig Start Date End Date Taking? Authorizing Provider  insulin aspart (NOVOLOG) 100 UNIT/ML injection Inject 25 Units into the skin 3 (three) times daily before meals.   Yes Historical Provider, MD  insulin glargine (LANTUS) 100 UNIT/ML injection Inject 20-35 Units into the skin at bedtime.    Yes Historical Provider, MD    Physical Exam: Filed Vitals:   07/12/14 0600 07/12/14 0615 07/12/14 0630 07/12/14 0645  BP: 133/47 152/94 116/37 115/72  Pulse: 92 127 93 97  Temp:      TempSrc:      Resp: 15 13 15 19   Height:      Weight:      SpO2: 97% 100% 98% 100%    General: Alert, Awake and Oriented to Time, Place and Person. Appear in mild distress Eyes: PERRL ENT: Oral Mucosa clear moist. Neck: no JVD Cardiovascular: S1 and S2 Present, no Murmur, Peripheral Pulses Present Respiratory: Bilateral Air entry equal and Decreased, Clear to Auscultation, noCrackles, no wheezes Abdomen: Bowel Sound Present, Soft  and Non tender Skin: no Rash Extremities: no Pedal edema, no calf tenderness Neurologic: Grossly no focal neuro deficit.  Labs on Admission:  CBC:  Recent Labs Lab 07/12/14 0115 07/12/14 0520  WBC 9.3 7.5  HGB 9.0* 7.6*  HCT 26.8* 22.7*  MCV 87.0 86.3  PLT 289 216    CMP     Component Value Date/Time   NA 135* 07/12/2014 0520   K 4.6 07/12/2014 0520   CL 100 07/12/2014 0520   CO2 22 07/12/2014 0520   GLUCOSE 317* 07/12/2014 0520   BUN 12 07/12/2014 0520   CREATININE 0.66  07/12/2014 0520   CALCIUM 8.6 07/12/2014 0520   PROT 6.1 07/12/2014 0520   ALBUMIN 3.0* 07/12/2014 0520   AST 19 07/12/2014 0520   ALT 12 07/12/2014 0520   ALKPHOS 67 07/12/2014 0520   BILITOT <0.2* 07/12/2014 0520   GFRNONAA 84* 07/12/2014 0520   GFRAA >90 07/12/2014 0520    No results found for this basename: LIPASE, AMYLASE,  in the last 168 hours No results found for this basename: AMMONIA,  in the last 168 hours  No results found for this basename: CKTOTAL, CKMB, CKMBINDEX, TROPONINI,  in the last 168 hours BNP (last 3 results) No results found for this basename: PROBNP,  in the last 8760 hours  Radiological Exams on Admission: Dg Abd 1 View  07/12/2014   CLINICAL DATA:  Rectal bleeding and lower abdominal pain for a couple of days.  EXAM: ABDOMEN - 1 VIEW  COMPARISON:  CT abdomen and pelvis 05/26/2014. MRI abdomen 05/27/2014.  FINDINGS: Gas and stool throughout the colon. No small or large bowel distention. No radiopaque stones. Degenerative changes throughout the lumbar spine. Previous bilateral hip arthroplasties.  IMPRESSION: Nonobstructive bowel gas pattern.   Electronically Signed   By: Lucienne Capers M.D.   On: 07/12/2014 04:54    Assessment/Plan Principal Problem:   GI bleed Active Problems:   Diverticulosis   Diabetes mellitus without complication   Hypertension   Osteoarthritis   Diarrhea   1. GI bleed The patient is presenting with complaints of bright red blood per rectum. She did have some abdominal cramps. At the time of my evaluation she does not have any abdominal pain. Her hemoglobin has dropped 1 g. As she is at present hemodynamically stable and does not appear to have any active bleeding. With which the patient will be admitted in the telemetry unit. We'll continue to monitor H&H we'll give Protonix keep her n.p.o. except medication. Give her IV hydration. Next anterior nausea medications as needed. We will consult gastroenterology in the morning. Probable  etiology still remains diverticular bleed. But possibility of infectious cause cannot be ruled out.  2. Diabetes mellitus. Placing the patient on sliding scale.  3. Hypertension. At present appears controlled. This continue her home medications.  DVT Prophylaxis: mechanical compression device Nutrition: nop  Code Status: Full  Disposition: Admitted toinpatient in telemetry unit.  Author: Berle Mull, MD [facility] Pager: [phone] 07/12/2014, 7:02 AM    If 7PM-7AM, please contact night-coverage [email or web address] Password [email or web address]  **Disclaimer: This note may have been dictated with voice recognition software. Similar sounding words can inadvertently be transcribed and this note may contain transcription errors which may not have been corrected upon publication of note.**

## 2014-07-12 NOTE — ED Provider Notes (Signed)
CSN: [id number]     Arrival date & time 07/12/14  0026 History   First MD Initiated Contact with Patient 07/12/14 0209     Chief Complaint  Patient presents with  . Rectal Bleeding     (Consider location/radiation/quality/duration/timing/severity/associated sxs/prior Treatment) HPI 78 year old female presents to the emergency department from home with complaint of GI bleed.  Patient daughter reports over the last 2 and half days she has been passing large amounts of blood and blood clots.  She reports just prior to having a bowel movement she has crampy pain to her lower abdomen.  Patient had similar presentation last month, CT scan unremarkable at that time, upper and lower endoscopies without source of bleeding found.  Patient is Jehovah's Witness and refuses blood products.  She reports that she feels weak and dizzy, especially when standing.  No fevers or chills.  No nausea or vomiting  Past Medical History  Diagnosis Date  . Diabetes mellitus without complication   . Hypertension   . Rheumatoid arthritis(714.0)   . Osteoarthritis   . Fibromyalgia   . Obesity   . Chronic neck pain    Past Surgical History  Procedure Laterality Date  . Joint replacement      bilat hips  . Tubal ligation    . Esophagogastroduodenoscopy N/A 05/27/2014    Procedure: ESOPHAGOGASTRODUODENOSCOPY (EGD);  Surgeon: Milus Banister, MD;  Location: [place];  Service: Endoscopy;  Laterality: N/A;  . Colonoscopy N/A 05/28/2014    Procedure: COLONOSCOPY;  Surgeon: Juanita Craver, MD;  Location: [facility] ENDOSCOPY;  Service: Endoscopy;  Laterality: N/A;  . Total hip arthroplasty Bilateral    History reviewed. No pertinent family history. History  Substance Use Topics  . Smoking status: Never Smoker   . Smokeless tobacco: Not on file  . Alcohol Use: No   OB History   Grav Para Term Preterm Abortions TAB SAB Ect Mult Living                 Review of Systems  See History of Present Illness; otherwise all  other systems are reviewed and negative    Allergies  Penicillins and Ivp dye  Home Medications   Prior to Admission medications   Medication Sig Start Date End Date Taking? Authorizing Provider  acetaminophen (TYLENOL) 500 MG tablet Take 250 mg by mouth every 6 (six) hours as needed for moderate pain.    Historical Provider, MD  docusate sodium (COLACE) 100 MG capsule Take 1 capsule (100 mg total) by mouth 2 (two) times daily. 05/29/14   Shanker Kristeen Mans, MD  insulin aspart (NOVOLOG) 100 UNIT/ML injection Inject 25 Units into the skin 3 (three) times daily before meals.    Historical Provider, MD  insulin glargine (LANTUS) 100 UNIT/ML injection Inject 35 Units into the skin at bedtime.    Historical Provider, MD  metoprolol tartrate (LOPRESSOR) 12.5 mg TABS tablet Take 0.5 tablets (12.5 mg total) by mouth 2 (two) times daily. 05/29/14   Shanker Kristeen Mans, MD  polyethylene glycol (MIRALAX / GLYCOLAX) packet Take 17 g by mouth daily. 05/29/14   Shanker Kristeen Mans, MD   BP 141/71  Pulse 84  Temp(Src) 97.7 F (36.5 C) (Oral)  Resp 22  Ht 5\' 6"  (1.676 m)  Wt 267 lb (121.11 kg)  BMI 43.12 kg/m2  SpO2 98% Physical Exam  Nursing note and vitals reviewed. Constitutional: She is oriented to person, place, and time. She appears well-developed and well-nourished. No distress.  HENT:  Head:  Normocephalic and atraumatic.  Right Ear: External ear normal.  Left Ear: External ear normal.  Nose: Nose normal.  Mouth/Throat: Oropharynx is clear and moist.  Eyes: Conjunctivae and EOM are normal. Pupils are equal, round, and reactive to light.  Neck: Normal range of motion. Neck supple. No JVD present. No tracheal deviation present. No thyromegaly present.  Cardiovascular: Normal rate, regular rhythm, normal heart sounds and intact distal pulses.  Exam reveals no gallop and no friction rub.   No murmur heard. Pulmonary/Chest: Effort normal and breath sounds normal. No stridor. No respiratory  distress. She has no wheezes. She has no rales. She exhibits no tenderness.  Abdominal: Soft. Bowel sounds are normal. She exhibits no distension and no mass. There is tenderness (diffuse mild tenderness to palpation). There is no rebound and no guarding.  Genitourinary: Guaiac positive stool.  Maroon-colored stool with blood clots  Musculoskeletal: Normal range of motion. She exhibits no edema and no tenderness.  Lymphadenopathy:    She has no cervical adenopathy.  Neurological: She is alert and oriented to person, place, and time. She exhibits normal muscle tone. Coordination normal.  Skin: Skin is warm and dry. No rash noted. No erythema. There is pallor.  Psychiatric: She has a normal mood and affect. Her behavior is normal. Judgment and thought content normal.    ED Course  Procedures (including critical care time) Labs Review Labs Reviewed  CBC - Abnormal; Notable for the following:    RBC 3.08 (*)    Hemoglobin 9.0 (*)    HCT 26.8 (*)    All other components within normal limits  COMPREHENSIVE METABOLIC PANEL - Abnormal; Notable for the following:    Glucose, Bld 330 (*)    Albumin 3.4 (*)    Total Bilirubin <0.2 (*)    GFR calc non Af Amer 82 (*)    All other components within normal limits  NO BLOOD PRODUCTS    Imaging Review No results found.   EKG Interpretation None      MDM   Final diagnoses:  Gastrointestinal hemorrhage with melena     78 year old female with lower GI bleed history of same last month without specific source of bleeding.  She is Jehovah's Witness, and has dropped hemoglobin by 1 point.  Patient does have some symptoms of acute blood loss with dizziness and weakness upon standing.  Orthostatics pending.  Do not feel she needs repeat CT scan at this time.  Will discuss with hospitalist for observation and GI consult in the morning.  If patient continues to drop or he will, she may need either epo or iron.      Kalman Drape, MD 07/12/14  [phone]

## 2014-07-12 NOTE — ED Notes (Signed)
Patient here with complaint of rectal bleeding and abdominal pain which started about 2 days ago. Reports passing blood described as dark and bright. Clots reported. Recent colonoscopy earlier this month with polyp removal. States no problems before today.

## 2014-07-12 NOTE — ED Notes (Signed)
Dr. Sharol Given in to see/ update pt/family, pending arrival of admitting MD.

## 2014-07-12 NOTE — Care Management Note (Addendum)
  Page 2 of 2   07/15/2014     10:56:03 AM CARE MANAGEMENT NOTE 07/15/2014  Patient:  Brittany Macdonald, Brittany Macdonald   Account Number:  [id number]  Date Initiated:  07/12/2014  Documentation initiated by:  Preston Weill  Subjective/Objective Assessment:   GI bleed     Action/Plan:   CM to follow for disposition needs   Anticipated DC Date:  07/14/2014   Anticipated DC Plan:  [place]  CM consult  Other      PAC Choice  DURABLE MEDICAL EQUIPMENT   Choice offered to / List presented to:  C-1 Patient      DME agency  [facility].        Status of service:  Completed, signed off Medicare Important Message given?  YES (If response is "NO", the following Medicare IM given date fields will be blank) Date Medicare IM given:  07/15/2014 Medicare IM given by:  Claudie Brickhouse Date Additional Medicare IM given:   Additional Medicare IM given by:    Discharge Disposition:  HOME/SELF CARE  Per UR Regulation:  Reviewed for med. necessity/level of care/duration of stay  If discussed at [place] of Stay Meetings, dates discussed:    Comments:  Nathan Moctezuma RN, BSN, MSHL, CCM  Nurse - Case Manager,  ([facility] [place])  [phone]  07/12/2014 Social:  From home with DTR/Tanya Hardin Negus.  Patient interested in Meals on Wheels PCP:  Dr. [name]:  last appt 05/2014 and next appt 08/2014 Also followed by Endocrinologist and Rheumatologist. Home DME:  RW, cane.  Patient states she would benefit from rollator and shower bench. CM notifed ([facility]/jermaine) who will discuss coverage and cost questions with patient. CM provided patient with resource Meals on Wheels and instructed in how to initiate request for services screening appt. Disposition Plan:  Home / Self Care.

## 2014-07-13 LAB — GLUCOSE, CAPILLARY
Glucose-Capillary: 142 mg/dL — ABNORMAL HIGH (ref 70–99)
Glucose-Capillary: 148 mg/dL — ABNORMAL HIGH (ref 70–99)
Glucose-Capillary: 158 mg/dL — ABNORMAL HIGH (ref 70–99)
Glucose-Capillary: 137 mg/dL — ABNORMAL HIGH (ref 70–99)

## 2014-07-13 LAB — CBC
HCT: 20.3 % — ABNORMAL LOW (ref 36.0–46.0)
HCT: 21.4 % — ABNORMAL LOW (ref 36.0–46.0)
Hemoglobin: 6.8 g/dL — CL (ref 12.0–15.0)
Hemoglobin: 7.1 g/dL — ABNORMAL LOW (ref 12.0–15.0)
MCH: 29.8 pg (ref 26.0–34.0)
MCH: 29.6 pg (ref 26.0–34.0)
MCHC: 33.5 g/dL (ref 30.0–36.0)
MCHC: 33.2 g/dL (ref 30.0–36.0)
MCV: 89 fL (ref 78.0–100.0)
MCV: 89.2 fL (ref 78.0–100.0)
Platelets: 202 10*3/uL (ref 150–400)
Platelets: 235 10*3/uL (ref 150–400)
RBC: 2.28 MIL/uL — ABNORMAL LOW (ref 3.87–5.11)
RBC: 2.4 MIL/uL — ABNORMAL LOW (ref 3.87–5.11)
RDW: 13.8 % (ref 11.5–15.5)
RDW: 13.9 % (ref 11.5–15.5)
WBC: 8.3 10*3/uL (ref 4.0–10.5)
WBC: 8.3 10*3/uL (ref 4.0–10.5)

## 2014-07-13 NOTE — Progress Notes (Signed)
Spoke with patient and her daughter about diabetes and home regimen for diabetes control. Patient reports that she is followed by her PCP for diabetes management and currently she takes Lantus 25 units QHS ("sometimes increases up to 35 units if CBGs are running high") and Novolog 25 units TID with meals as an outpatient for diabetes control.  Inquired about knowledge about A1C and patient reports that she knows what an A1C is and she has not been able to have it checked as often as she was due to financial issues. However, she does report that her last A1C was high and prior to that it was always <7.0%.  Discussed A1C results (9.6% on 06/2514) and discussed basic pathophysiology of DM Type 2, basic home care, importance of checking CBGs and maintaining good CBG control to prevent long-term and short-term complications. Discussed impact of nutrition, exercise, stress, sickness, and medications on diabetes control.  Patient states that she checks her blood sugar 3-4 times per day and it has been running in upper 100's-200's mg/dl.  Discussed carbohydrates, carbohydrate goals per day and meal, along with portion sizes. Encouraged patient to keep a log of her blood glucose to take with her to follow up visits with her PCP so changes could be made with her diabetes control regimen if needed. Patient verbalized understanding of information discussed and she states that she has no further questions at this time related to diabetes.   Thanks, Barnie Alderman, RN, MSN, CCRN Diabetes Coordinator Inpatient Diabetes Program [phone] (Team Pager) [phone] ([facility] office) [phone] [facility] office)

## 2014-07-13 NOTE — Progress Notes (Signed)
CRITICAL VALUE ALERT  Critical value received:  Hemoglobin 6.8  Date of notification:  07/13/14  Time of notification:  0625  Critical value read back:Yes.    Nurse who received alert:  [place]  MD notified (1st page):  Tylene Fantasia  Time of first page:  0629  MD notified (2nd page):  Time of second page:  Responding MD:  awaiting  Time MD responded:  awaiting

## 2014-07-13 NOTE — Clinical Documentation Improvement (Signed)
Possible Clinical Conditions?   _______Diabetes Type 1 or 2 _______Controlled or Uncontrolled  Manifestations:  _______DM retinopathy  _______DM PVD _______DM neuropathy   _______DM nephropathy  _______Other Condition _______Cannot Clinically determine    Risk Factors: Diabetes mellitus without complication, continue lantus plus sliding scale insulin, per 8/26 progress notes.  Diagnostics: 8/25: HgbA1c: 9.6. 8/25: mean plasma glucose: 229.  8/25: glucose, blood: 330  Thank You, Theron Arista, Clinical Documentation Specialist:  [place] Information Management

## 2014-07-13 NOTE — Progress Notes (Addendum)
[facility] PROGRESS NOTE Interim History: 78 y.o. female with Past medical history of diabetes mellitus, hypertension, arthritis, fibromyalgia, diverticulosis, recent GI bleed. Recent colonoscopy no source of bleeding, some diverticula and polyps removed.    Assessment/Plan: Acute *GI bleed most likely due to Diverticulosis.iron deficiency anemia: - Jehovah witness and declines blood transfusion, despite any RISK including death - s/p aranesp and IV iron 8/25 - CBC q12 hrs. -EGD 7/15 negative, colonoscopy with diverticulosis per [place]  - May repeat Aranesp in 1 week. - no evidence of severe ongoing bleeding at this time, bleeding scan if active bleeding ensues  Diarrhea: - resolved, GI pathogen panel negative   Osteoarthritis - tylenol.  Hypertension - hold BP meds at this time  Diabetes mellitus without complication - cont lantus plus SSI.  Liver lesion -needs FU, appeared like focal fat infiltration on MRI     Code Status: Full  Disposition: home when stable  Consultants:  none  Procedures:  abd x-ray  Antibiotics:  None  HPI/Subjective: No complains, she wants to eat, no bleeding yesterday, 1 episode this am  Objective: Filed Vitals:   07/12/14 1234 07/12/14 1426 07/12/14 2033 07/13/14 0618  BP: 152/68 155/75 149/62 136/56  Pulse: 97 88 83 87  Temp: 98.4 F (36.9 C) 98.1 F (36.7 C) 98.3 F (36.8 C) 98.1 F (36.7 C)  TempSrc: Oral Oral Oral Oral  Resp: 18 18 18 17   Height: 5\' 6"  (1.676 m)     Weight: 117.9 kg (259 lb 14.8 oz)   116.6 kg (257 lb 0.9 oz)  SpO2: 100% 100% 98% 96%    Intake/Output Summary (Last 24 hours) at 07/13/14 0805 Last data filed at 07/12/14 2200  Gross per 24 hour  Intake    243 ml  Output      1 ml  Net    242 ml   Filed Weights   07/12/14 0053 07/12/14 1234 07/13/14 0618  Weight: 121.11 kg (267 lb) 117.9 kg (259 lb 14.8 oz) 116.6 kg (257 lb 0.9 oz)    Exam:  General: Alert, awake, oriented x3, in  no acute distress.  HEENT: No bruits, no goiter.  Heart: Regular rate and rhythm. Lungs: Good air movement, clear Abdomen: Soft, nontender, nondistended, positive bowel sounds.    Data Reviewed: Basic Metabolic Panel:  Recent Labs Lab 07/12/14 0115 07/12/14 0520  NA 137 135*  K 4.3 4.6  CL 99 100  CO2 23 22  GLUCOSE 330* 317*  BUN 14 12  CREATININE 0.72 0.66  CALCIUM 8.9 8.6   Liver Function Tests:  Recent Labs Lab 07/12/14 0115 07/12/14 0520  AST 19 19  ALT 14 12  ALKPHOS 78 67  BILITOT <0.2* <0.2*  PROT 6.9 6.1  ALBUMIN 3.4* 3.0*   No results found for this basename: LIPASE, AMYLASE,  in the last 168 hours No results found for this basename: AMMONIA,  in the last 168 hours CBC:  Recent Labs Lab 07/12/14 0115 07/12/14 0520 07/13/14 0344  WBC 9.3 7.5 8.3  HGB 9.0* 7.6* 6.8*  HCT 26.8* 22.7* 20.3*  MCV 87.0 86.3 89.0  PLT 289 216 202   Cardiac Enzymes: No results found for this basename: CKTOTAL, CKMB, CKMBINDEX, TROPONINI,  in the last 168 hours BNP (last 3 results) No results found for this basename: PROBNP,  in the last 8760 hours CBG:  Recent Labs Lab 07/12/14 0739 07/12/14 1130 07/12/14 1635 07/12/14 2132  GLUCAP 274* 196* 156* 155*    Recent Results (  from the past 240 hour(s))  CLOSTRIDIUM DIFFICILE BY PCR     Status: None   Collection Time    07/12/14  7:00 AM      Result Value Ref Range Status   C difficile by pcr NEGATIVE  NEGATIVE Final     Studies: Dg Abd 1 View  07/12/2014   CLINICAL DATA:  Rectal bleeding and lower abdominal pain for a couple of days.  EXAM: ABDOMEN - 1 VIEW  COMPARISON:  CT abdomen and pelvis 05/26/2014. MRI abdomen 05/27/2014.  FINDINGS: Gas and stool throughout the colon. No small or large bowel distention. No radiopaque stones. Degenerative changes throughout the lumbar spine. Previous bilateral hip arthroplasties.  IMPRESSION: Nonobstructive bowel gas pattern.   Electronically Signed   By: Lucienne Capers  M.D.   On: 07/12/2014 04:54    Scheduled Meds: . insulin aspart  0-15 Units Subcutaneous TID WC  . insulin aspart  0-5 Units Subcutaneous QHS  . insulin aspart  3 Units Subcutaneous TID WC  . insulin glargine  20 Units Subcutaneous QHS  . pantoprazole (PROTONIX) IV  40 mg Intravenous Q12H  . sodium chloride  3 mL Intravenous Q12H   Continuous Infusions:    [facility]  [facility] Pager [phone]. If 8PM-8AM, please contact night-coverage at [email or web address], password [facility] 07/13/2014, 8:05 AM  LOS: 1 day      **Disclaimer: This note may have been dictated with voice recognition software. Similar sounding words can inadvertently be transcribed and this note may contain transcription errors which may not have been corrected upon publication of note.**

## 2014-07-14 LAB — CBC
HCT: 20.9 % — ABNORMAL LOW (ref 36.0–46.0)
HEMOGLOBIN: 6.8 g/dL — AB (ref 12.0–15.0)
MCH: 28.6 pg (ref 26.0–34.0)
MCHC: 32.5 g/dL (ref 30.0–36.0)
MCV: 87.8 fL (ref 78.0–100.0)
Platelets: 230 10*3/uL (ref 150–400)
RBC: 2.38 MIL/uL — AB (ref 3.87–5.11)
RDW: 14.2 % (ref 11.5–15.5)
WBC: 7.4 10*3/uL (ref 4.0–10.5)

## 2014-07-14 LAB — GLUCOSE, CAPILLARY
GLUCOSE-CAPILLARY: 108 mg/dL — AB (ref 70–99)
GLUCOSE-CAPILLARY: 179 mg/dL — AB (ref 70–99)
GLUCOSE-CAPILLARY: 155 mg/dL — AB (ref 70–99)
Glucose-Capillary: 153 mg/dL — ABNORMAL HIGH (ref 70–99)

## 2014-07-14 MED ORDER — DARBEPOETIN ALFA-POLYSORBATE 100 MCG/0.5ML IJ SOLN: 100.0000 ug | Freq: Once | INTRAMUSCULAR | Status: DC

## 2014-07-14 NOTE — Plan of Care (Signed)
Problem: Phase I Progression Outcomes Goal: Hemodynamically stable Outcome: Progressing Patient has no complaints this shift.  Continue to monitor H/H as ordered q12hrs.  Hemoglobin slowly recovering.  Continue to monitor patient BP.  Will continue to monitor patient condition.

## 2014-07-14 NOTE — Evaluation (Signed)
Physical Therapy Evaluation Patient Details Name: Brittany Macdonald MRN: [id number] DOB: [birth year] Today's Date: 07/14/2014   History of Present Illness  Pt is a 78 y/o female presenting with complaints of diarrhea along with blood in the bowel. She mentions that she was at her baseline until 2 days ago she started having episodes of abdominal cramps this was followed by diarrhea with bright red blood and dark color blocks. The cramps has resolved pretty much at the time of my evaluation. She did have 4-5 episodes of those walk watery bowel movement she was which were bright red. Past medical history of diabetes mellitus, hypertension, arthritis, fibromyalgia, diverticulosis, recent GI bleed.   Clinical Impression  Pt admitted with the above. Pt currently with functional limitations due to the deficits listed below (see PT Problem List). At the time of PT eval pt was able to ambulate without an AD, however generally does better with UE support. Recommending a rollator so pt has option for seated rest break if needed due to painful arthritic knees. Pt will benefit from skilled PT to increase their independence and safety with mobility to allow discharge to the venue listed below.       Follow Up Recommendations No PT follow up    Equipment Recommendations  Other (comment) (Tub bench, rollator)   Recommendations for Other Services       Precautions / Restrictions Precautions Precautions: Fall      Mobility  Bed Mobility Overal bed mobility: Modified Independent             General bed mobility comments: No physical assist. Therapist assisting with pulling blankets back and pt stating "I can do it, honey."  Transfers Overall transfer level: Modified independent Equipment used: None             General transfer comment: Pt able to transition to standing with no physical assist. Pt with no balance disturbances noted.  Ambulation/Gait Ambulation/Gait assistance: Min  guard Ambulation Distance (Feet): 125 Feet Assistive device: Rolling walker (2 wheeled) Gait Pattern/deviations: Step-through pattern;Decreased stride length;Trunk flexed Gait velocity: Decreased Gait velocity interpretation: Below normal speed for age/gender General Gait Details: Pt able to ambulate without assist and without AD. Occasionally grabbing for railings in the hallway when knees would hurt. Audible popping/grinding of knee joints during gait training. Pt used the RW when back in room per therapist's request, and pt appeared much more comfortable with ambulation, stating she felt more "steady".  Stairs            Wheelchair Mobility    Modified Rankin (Stroke Patients Only)       Balance Overall balance assessment: Needs assistance Sitting-balance support: Feet supported;No upper extremity supported Sitting balance-Leahy Scale: Good     Standing balance support: No upper extremity supported Standing balance-Leahy Scale: Fair                               Pertinent Vitals/Pain Pain Assessment: No/denies pain    Home Living Family/patient expects to be discharged to:: Private residence Living Arrangements: Children;Other relatives Available Help at Discharge: Family;Available 24 hours/day Type of Home: House Home Access: Ramped entrance     Home Layout: One level Home Equipment: Cane - single point;Walker - 2 wheels Additional Comments: About to move (Oct 3) to another house with 3 steps to enter home.     Prior Function Level of Independence: Needs assistance   Gait / Transfers Assistance Needed:  Only needing assist getting in/out of tub  ADL's / Homemaking Assistance Needed: Daughter assists with washing back.         Hand Dominance   Dominant Hand: Right    Extremity/Trunk Assessment   Upper Extremity Assessment: Defer to OT evaluation           Lower Extremity Assessment: RLE deficits/detail;LLE deficits/detail RLE  Deficits / Details: Decreased strength and AROM consistent with arthritis of the knee (L more limited with pain than right). Pt states she is planning knee replacements in the future bilaterally.  LLE Deficits / Details: Decreased strength and AROM consistent with arthritis of the knee (L more limited with pain than right). Pt states she is planning knee replacements in the future bilaterally.   Cervical / Trunk Assessment: Kyphotic  Communication   Communication: No difficulties  Cognition Arousal/Alertness: Awake/alert Behavior During Therapy: WFL for tasks assessed/performed Overall Cognitive Status: Within Functional Limits for tasks assessed                      General Comments      Exercises        Assessment/Plan    PT Assessment Patient needs continued PT services  PT Diagnosis Difficulty walking;Generalized weakness   PT Problem List Decreased strength;Decreased range of motion;Decreased activity tolerance;Decreased balance;Decreased mobility;Decreased knowledge of use of DME;Decreased safety awareness;Decreased knowledge of precautions;Pain  PT Treatment Interventions DME instruction;Gait training;Stair training;Functional mobility training;Therapeutic activities;Therapeutic exercise;Neuromuscular re-education;Patient/family education   PT Goals (Current goals can be found in the Care Plan section) Acute Rehab PT Goals Patient Stated Goal: To return home PT Goal Formulation: With patient Time For Goal Achievement: 07/21/14 Potential to Achieve Goals: Good    Frequency Min 3X/week   Barriers to discharge        Co-evaluation               End of Session Equipment Utilized During Treatment: Gait belt Activity Tolerance: Patient tolerated treatment well Patient left: in chair;with call bell/phone within reach Nurse Communication: Mobility status         Time: 3545-6256 PT Time Calculation (min): 27 min   Charges:   PT Evaluation $Initial PT  Evaluation Tier I: 1 Procedure PT Treatments $Gait Training: 8-22 mins $Therapeutic Activity: 8-22 mins   PT G CodesJolyn Lent 07/14/2014, 11:56 AM  Jolyn Lent, PT, DPT Acute Rehabilitation Services Pager: [phone]

## 2014-07-14 NOTE — Progress Notes (Signed)
[facility] PROGRESS NOTE Interim History: 78 y.o. female with Past medical history of diabetes mellitus, hypertension, arthritis, fibromyalgia, diverticulosis, recent GI bleed. Recent colonoscopy no source of bleeding, some diverticula and polyps removed.  Assessment/Plan: Lower GI bleed most likely due to Diverticulosis/iron deficiency anemia: - Jehovah witness and declines blood transfusion, despite any RISK including death - s/p aranesp and IV iron 8/25 - fortunately bleeding appears to have subsided -EGD 7/15 negative, colonoscopy with diverticulosis per [facility]  - May repeat Aranesp in 1 week. - hopefully home tomorrow  Diarrhea: - resolved, GI pathogen panel negative   Osteoarthritis - tylenol.  Hypertension - hold BP meds at this time  Uncontrolled Diabetes mellitus  - cont lantus plus SSI.  Liver lesion -needs FU, appeared like focal fat infiltration on MRI     Code Status: Full  Disposition: home tomorrow if stable  Consultants:  none  Procedures:  abd x-ray  Antibiotics:  None  HPI/Subjective: No further bleeding since episode yesterday am  Objective: Filed Vitals:   07/13/14 1357 07/13/14 2040 07/14/14 0435 07/14/14 1350  BP: 149/74 138/54 142/52 102/40  Pulse: 86 80 88 79  Temp: 98.5 F (36.9 C) 98 F (36.7 C) 98 F (36.7 C) 98.3 F (36.8 C)  TempSrc: Oral Oral Oral Oral  Resp:  17 20 18   Height:      Weight:   116.8 kg (257 lb 8 oz)   SpO2: 98% 97% 97% 98%    Intake/Output Summary (Last 24 hours) at 07/14/14 1541 Last data filed at 07/14/14 1327  Gross per 24 hour  Intake    820 ml  Output   1425 ml  Net   -605 ml   Filed Weights   07/12/14 1234 07/13/14 0618 07/14/14 0435  Weight: 117.9 kg (259 lb 14.8 oz) 116.6 kg (257 lb 0.9 oz) 116.8 kg (257 lb 8 oz)    Exam:  General: Alert, awake, oriented x3, in no acute distress.  HEENT: No bruits, no goiter.  Heart: Regular rate and rhythm. Lungs: Good air movement,  clear Abdomen: Soft, nontender, nondistended, positive bowel sounds.    Data Reviewed: Basic Metabolic Panel:  Recent Labs Lab 07/12/14 0115 07/12/14 0520  NA 137 135*  K 4.3 4.6  CL 99 100  CO2 23 22  GLUCOSE 330* 317*  BUN 14 12  CREATININE 0.72 0.66  CALCIUM 8.9 8.6   Liver Function Tests:  Recent Labs Lab 07/12/14 0115 07/12/14 0520  AST 19 19  ALT 14 12  ALKPHOS 78 67  BILITOT <0.2* <0.2*  PROT 6.9 6.1  ALBUMIN 3.4* 3.0*   No results found for this basename: LIPASE, AMYLASE,  in the last 168 hours No results found for this basename: AMMONIA,  in the last 168 hours CBC:  Recent Labs Lab 07/12/14 0115 07/12/14 0520 07/13/14 0344 07/13/14 1557 07/14/14 1220  WBC 9.3 7.5 8.3 8.3 7.4  HGB 9.0* 7.6* 6.8* 7.1* 6.8*  HCT 26.8* 22.7* 20.3* 21.4* 20.9*  MCV 87.0 86.3 89.0 89.2 87.8  PLT 289 216 202 235 230   Cardiac Enzymes: No results found for this basename: CKTOTAL, CKMB, CKMBINDEX, TROPONINI,  in the last 168 hours BNP (last 3 results) No results found for this basename: PROBNP,  in the last 8760 hours CBG:  Recent Labs Lab 07/13/14 1126 07/13/14 1717 07/13/14 2106 07/14/14 0643 07/14/14 1040  GLUCAP 148* 158* 137* 108* 179*    Recent Results (from the past 240 hour(s))  CLOSTRIDIUM DIFFICILE BY  PCR     Status: None   Collection Time    07/12/14  7:00 AM      Result Value Ref Range Status   C difficile by pcr NEGATIVE  NEGATIVE Final     Studies: No results found.  Scheduled Meds: . insulin aspart  0-15 Units Subcutaneous TID WC  . insulin aspart  0-5 Units Subcutaneous QHS  . insulin aspart  3 Units Subcutaneous TID WC  . insulin glargine  20 Units Subcutaneous QHS  . pantoprazole (PROTONIX) IV  40 mg Intravenous Q12H  . sodium chloride  3 mL Intravenous Q12H   Continuous Infusions:    [facility]  [facility] Pager [phone]. If 8PM-8AM, please contact night-coverage at [email or web address], password [facility] 07/14/2014, 3:41 PM   LOS: 2 days      **Disclaimer: This note may have been dictated with voice recognition software. Similar sounding words can inadvertently be transcribed and this note may contain transcription errors which may not have been corrected upon publication of note.**

## 2014-07-15 LAB — CBC
HCT: 19.5 % — ABNORMAL LOW (ref 36.0–46.0)
HEMOGLOBIN: 6.4 g/dL — AB (ref 12.0–15.0)
MCH: 29.9 pg (ref 26.0–34.0)
MCHC: 32.8 g/dL (ref 30.0–36.0)
MCV: 91.1 fL (ref 78.0–100.0)
PLATELETS: 201 10*3/uL (ref 150–400)
RBC: 2.14 MIL/uL — ABNORMAL LOW (ref 3.87–5.11)
RDW: 14.5 % (ref 11.5–15.5)
WBC: 6.7 10*3/uL (ref 4.0–10.5)

## 2014-07-15 LAB — GLUCOSE, CAPILLARY: GLUCOSE-CAPILLARY: 166 mg/dL — AB (ref 70–99)

## 2014-07-15 MED ORDER — FERROUS SULFATE 325 (65 FE) MG PO TABS: 325.0000 mg | ORAL_TABLET | Freq: Two times a day (BID) | ORAL | Status: DC

## 2014-07-15 MED ORDER — INSULIN ASPART 100 UNIT/ML ~~LOC~~ SOLN: 5.0000 [IU] | Freq: Three times a day (TID) | SUBCUTANEOUS | Status: AC

## 2014-07-15 MED ORDER — INSULIN GLARGINE 100 UNIT/ML ~~LOC~~ SOLN: 20.0000 [IU] | Freq: Every day | SUBCUTANEOUS | Status: AC

## 2014-07-15 NOTE — Progress Notes (Signed)
Patient given discharge instructions and all questions answered.  Patient discharged via wheelchair with all belongings.   

## 2014-07-15 NOTE — Progress Notes (Signed)
UR completed Loney Peto K. Zelta Enfield, RN, BSN, MSHL, CCM  07/15/2014 10:49 AM

## 2014-07-15 NOTE — Discharge Summary (Signed)
Physician Discharge Summary  Brittany Macdonald:[id number] DOB: 10-Dec-1935 DOA: 07/12/2014  PCP: [name], MD  Admit date: 07/12/2014 Discharge date: 07/15/2014  Time spent: 45 minutes  Recommendations for Outpatient Follow-up:  1. Dr.Morrow in 1 week, repeat CBC then 2. May benefit from repeat SQ Aranesp  Discharge Diagnoses:  Principal Problem:   Diverticular bleed   Acute blood loss anemia   Diverticulosis   Diabetes mellitus without complication   Hypertension   Osteoarthritis   Diarrhea   Discharge Condition: stable  Diet recommendation:diabetic  Filed Weights   07/13/14 0618 07/14/14 0435 07/15/14 0446  Weight: 116.6 kg (257 lb 0.9 oz) 116.8 kg (257 lb 8 oz) 116.9 kg (257 lb 11.5 oz)    History of present illness:  Brittany Macdonald is a 78 y.o. female with Past medical history of diabetes mellitus, hypertension, arthritis, fibromyalgia, diverticulosis, recent GI bleed.  The patient presented with complaints of diarrhea along with blood in the bowel.  She mentioned that she was at her baseline until 2 days prior to admission and  she started having episodes of abdominal cramps this was followed by diarrhea with bright red blood and dark color blocks.She did have 4-5 episodes of watery bowel movements which were bright red   Hospital Course:  Lower GI bleed most likely due to Diverticulosis - Jehovah witness and declined blood transfusion, despite any RISK including death, hb of 6.6-7 through admission - Given SQ aranesp and IV iron 8/25  - fortunately bleeding subsided  - just had EGD 7/15 negative, colonoscopy 7/15 with diverticulosis per [facility] - May repeat Aranesp in 1 week.  - despite anemia fortunately she is not symptomatic  Acute Blood loss anemia -as above  Diarrhea:  - resolved, GI pathogen panel negative   Osteoarthritis  - tylenol.   Hypertension  - stable, not on meds   Uncontrolled Diabetes mellitus  - cont lantus plus SSI.    Liver lesion  -needs FU with GI, appeared like focal fat infiltration on MRI      Discharge Exam: Filed Vitals:   07/15/14 0446  BP: 137/58  Pulse: 82  Temp: 98.3 F (36.8 C)  Resp: 20    General: AAOx3 Cardiovascular: S1S2/RRR Respiratory: CTAB  Discharge Instructions You were cared for by a hospitalist during your hospital stay. If you have any questions about your discharge medications or the care you received while you were in the hospital after you are discharged, you can call the unit and asked to speak with the hospitalist on call if the hospitalist that took care of you is not available. Once you are discharged, your primary care physician will handle any further medical issues. Please note that NO REFILLS for any discharge medications will be authorized once you are discharged, as it is imperative that you return to your primary care physician (or establish a relationship with a primary care physician if you do not have one) for your aftercare needs so that they can reassess your need for medications and monitor your lab values.  Discharge Instructions   Diet Carb Modified    Complete by:  As directed      Increase activity slowly    Complete by:  As directed             Medication List         ferrous sulfate 325 (65 FE) MG tablet  Commonly known as:  FERROUSUL  Take 1 tablet (325 mg total) by mouth 2 (two) times  daily with a meal. Start after 3-4days     insulin aspart 100 UNIT/ML injection  Commonly known as:  novoLOG  Inject 5 Units into the skin 3 (three) times daily before meals.     insulin glargine 100 UNIT/ML injection  Commonly known as:  LANTUS  Inject 0.2 mLs (20 Units total) into the skin at bedtime.       Allergies  Allergen Reactions  . Penicillins Anaphylaxis  . Ivp Dye [Iodinated Diagnostic Agents] Other (See Comments)    Goes into shock  . Metoprolol Other (See Comments)    Headache        Follow-up Information   Follow up  with [name], MD. Schedule an appointment as soon as possible for a visit on 07/22/2014. (@10 :45 am spoke with Nita with [facility])    Specialty:  Family Medicine   Contact information:   [address] [place] [phone]       Follow up with [facility]. Schedule an appointment as soon as possible for a visit in 1 week.       The results of significant diagnostics from this hospitalization (including imaging, microbiology, ancillary and laboratory) are listed below for reference.    Significant Diagnostic Studies: Dg Abd 1 View  07/12/2014   CLINICAL DATA:  Rectal bleeding and lower abdominal pain for a couple of days.  EXAM: ABDOMEN - 1 VIEW  COMPARISON:  CT abdomen and pelvis 05/26/2014. MRI abdomen 05/27/2014.  FINDINGS: Gas and stool throughout the colon. No small or large bowel distention. No radiopaque stones. Degenerative changes throughout the lumbar spine. Previous bilateral hip arthroplasties.  IMPRESSION: Nonobstructive bowel gas pattern.   Electronically Signed   By: Lucienne Capers M.D.   On: 07/12/2014 04:54    Microbiology: Recent Results (from the past 240 hour(s))  CLOSTRIDIUM DIFFICILE BY PCR     Status: None   Collection Time    07/12/14  7:00 AM      Result Value Ref Range Status   C difficile by pcr NEGATIVE  NEGATIVE Final     Labs: Basic Metabolic Panel:  Recent Labs Lab 07/12/14 0115 07/12/14 0520  NA 137 135*  K 4.3 4.6  CL 99 100  CO2 23 22  GLUCOSE 330* 317*  BUN 14 12  CREATININE 0.72 0.66  CALCIUM 8.9 8.6   Liver Function Tests:  Recent Labs Lab 07/12/14 0115 07/12/14 0520  AST 19 19  ALT 14 12  ALKPHOS 78 67  BILITOT <0.2* <0.2*  PROT 6.9 6.1  ALBUMIN 3.4* 3.0*   No results found for this basename: LIPASE, AMYLASE,  in the last 168 hours No results found for this basename: AMMONIA,  in the last 168 hours CBC:  Recent Labs Lab 07/12/14 0520 07/13/14 0344 07/13/14 1557 07/14/14 1220  07/15/14 0650  WBC 7.5 8.3 8.3 7.4 6.7  HGB 7.6* 6.8* 7.1* 6.8* 6.4*  HCT 22.7* 20.3* 21.4* 20.9* 19.5*  MCV 86.3 89.0 89.2 87.8 91.1  PLT 216 202 235 230 201   Cardiac Enzymes: No results found for this basename: CKTOTAL, CKMB, CKMBINDEX, TROPONINI,  in the last 168 hours BNP: BNP (last 3 results) No results found for this basename: PROBNP,  in the last 8760 hours CBG:  Recent Labs Lab 07/14/14 0643 07/14/14 1040 07/14/14 1551 07/14/14 2055 07/15/14 0619  GLUCAP 108* 179* 155* 153* 166*       Signed:  Dillan Candela  [facility] 07/15/2014, 11:37 AM

## 2014-07-22 DIAGNOSIS — Z09 Encounter for follow-up examination after completed treatment for conditions other than malignant neoplasm: Secondary | ICD-10-CM | POA: Diagnosis not present

## 2014-07-22 DIAGNOSIS — K922 Gastrointestinal hemorrhage, unspecified: Secondary | ICD-10-CM | POA: Diagnosis not present

## 2014-07-22 DIAGNOSIS — I10 Essential (primary) hypertension: Secondary | ICD-10-CM | POA: Diagnosis not present

## 2014-07-22 DIAGNOSIS — E119 Type 2 diabetes mellitus without complications: Secondary | ICD-10-CM | POA: Diagnosis not present

## 2014-07-22 DIAGNOSIS — D649 Anemia, unspecified: Secondary | ICD-10-CM | POA: Diagnosis not present

## 2015-06-12 DIAGNOSIS — J984 Other disorders of lung: Secondary | ICD-10-CM | POA: Diagnosis not present

## 2015-06-12 DIAGNOSIS — E1165 Type 2 diabetes mellitus with hyperglycemia: Secondary | ICD-10-CM | POA: Diagnosis not present

## 2015-06-12 DIAGNOSIS — D649 Anemia, unspecified: Secondary | ICD-10-CM | POA: Diagnosis not present

## 2015-06-12 DIAGNOSIS — Z1211 Encounter for screening for malignant neoplasm of colon: Secondary | ICD-10-CM | POA: Diagnosis not present

## 2015-06-12 DIAGNOSIS — M1990 Unspecified osteoarthritis, unspecified site: Secondary | ICD-10-CM | POA: Diagnosis not present

## 2015-06-21 ENCOUNTER — Other Ambulatory Visit: Payer: Self-pay | Admitting: Family Medicine

## 2015-06-21 DIAGNOSIS — R918 Other nonspecific abnormal finding of lung field: Secondary | ICD-10-CM

## 2015-06-26 ENCOUNTER — Ambulatory Visit
Admission: RE | Admit: 2015-06-26 | Discharge: 2015-06-26 | Disposition: A | Discharge status: Home / Self Care | Payer: Medicare Other | Source: Ambulatory Visit | Attending: Family Medicine | Admitting: Family Medicine

## 2015-06-26 DIAGNOSIS — R918 Other nonspecific abnormal finding of lung field: Secondary | ICD-10-CM

## 2015-07-07 ENCOUNTER — Emergency Department (HOSPITAL_COMMUNITY)
Admission: EM | Admit: 2015-07-07 | Discharge: 2015-07-08 | Disposition: A | Discharge status: Home / Self Care | Payer: Medicare Other | Attending: Emergency Medicine | Admitting: Emergency Medicine

## 2015-07-07 ENCOUNTER — Encounter (HOSPITAL_COMMUNITY): Payer: Self-pay | Admitting: Vascular Surgery

## 2015-07-07 DIAGNOSIS — N390 Urinary tract infection, site not specified: Secondary | ICD-10-CM

## 2015-07-07 DIAGNOSIS — I10 Essential (primary) hypertension: Secondary | ICD-10-CM | POA: Insufficient documentation

## 2015-07-07 DIAGNOSIS — E119 Type 2 diabetes mellitus without complications: Secondary | ICD-10-CM | POA: Insufficient documentation

## 2015-07-07 DIAGNOSIS — E669 Obesity, unspecified: Secondary | ICD-10-CM | POA: Insufficient documentation

## 2015-07-07 DIAGNOSIS — Z794 Long term (current) use of insulin: Secondary | ICD-10-CM | POA: Insufficient documentation

## 2015-07-07 DIAGNOSIS — Z8719 Personal history of other diseases of the digestive system: Secondary | ICD-10-CM | POA: Insufficient documentation

## 2015-07-07 DIAGNOSIS — Z8739 Personal history of other diseases of the musculoskeletal system and connective tissue: Secondary | ICD-10-CM | POA: Insufficient documentation

## 2015-07-07 DIAGNOSIS — G8929 Other chronic pain: Secondary | ICD-10-CM | POA: Insufficient documentation

## 2015-07-07 DIAGNOSIS — N281 Cyst of kidney, acquired: Secondary | ICD-10-CM | POA: Diagnosis not present

## 2015-07-07 DIAGNOSIS — K5730 Diverticulosis of large intestine without perforation or abscess without bleeding: Secondary | ICD-10-CM | POA: Diagnosis not present

## 2015-07-07 DIAGNOSIS — R109 Unspecified abdominal pain: Secondary | ICD-10-CM | POA: Diagnosis present

## 2015-07-07 DIAGNOSIS — Z880 Allergy status to penicillin: Secondary | ICD-10-CM | POA: Diagnosis not present

## 2015-07-07 DIAGNOSIS — D259 Leiomyoma of uterus, unspecified: Secondary | ICD-10-CM | POA: Diagnosis not present

## 2015-07-07 LAB — COMPREHENSIVE METABOLIC PANEL
ALT: 12 U/L — ABNORMAL LOW (ref 14–54)
AST: 17 U/L (ref 15–41)
Albumin: 3.6 g/dL (ref 3.5–5.0)
Alkaline Phosphatase: 85 U/L (ref 38–126)
Anion gap: 8 (ref 5–15)
BUN: 10 mg/dL (ref 6–20)
CHLORIDE: 98 mmol/L — AB (ref 101–111)
CO2: 29 mmol/L (ref 22–32)
CREATININE: 0.84 mg/dL (ref 0.44–1.00)
Calcium: 9.2 mg/dL (ref 8.9–10.3)
GFR calc Af Amer: >60 mL/min (ref >60)
Glucose, Bld: 259 mg/dL — ABNORMAL HIGH (ref 65–99)
POTASSIUM: 4.3 mmol/L (ref 3.5–5.1)
Sodium: 135 mmol/L (ref 135–145)
Total Bilirubin: 0.4 mg/dL (ref 0.3–1.2)
Total Protein: 7.4 g/dL (ref 6.5–8.1)

## 2015-07-07 LAB — URINALYSIS, ROUTINE W REFLEX MICROSCOPIC
BILIRUBIN URINE: NEGATIVE
Glucose, UA: 250 mg/dL — AB
Ketones, ur: NEGATIVE mg/dL
Nitrite: POSITIVE — AB
PH: 5.5 (ref 5.0–8.0)
Protein, ur: 30 mg/dL — AB
SPECIFIC GRAVITY, URINE: 1.009 (ref 1.005–1.030)
Urobilinogen, UA: 0.2 mg/dL (ref 0.0–1.0)

## 2015-07-07 LAB — CBC
HCT: 39.4 % (ref 36.0–46.0)
Hemoglobin: 13.2 g/dL (ref 12.0–15.0)
MCH: 29.2 pg (ref 26.0–34.0)
MCHC: 33.5 g/dL (ref 30.0–36.0)
MCV: 87.2 fL (ref 78.0–100.0)
PLATELETS: 224 10*3/uL (ref 150–400)
RBC: 4.52 MIL/uL (ref 3.87–5.11)
RDW: 13.2 % (ref 11.5–15.5)
WBC: 5.4 10*3/uL (ref 4.0–10.5)

## 2015-07-07 LAB — URINE MICROSCOPIC-ADD ON

## 2015-07-07 LAB — LIPASE, BLOOD: LIPASE: 39 U/L (ref 22–51)

## 2015-07-07 MED ORDER — SULFAMETHOXAZOLE-TRIMETHOPRIM 800-160 MG PO TABS
1.0000 | ORAL_TABLET | Freq: Once | ORAL | Status: AC
  Administered 2015-07-08: 1 via ORAL
  Filled 2015-07-07: qty 1

## 2015-07-07 MED ORDER — BARIUM SULFATE 2.1 % PO SUSP
ORAL | Status: AC
  Filled 2015-07-07: qty 2

## 2015-07-07 MED ORDER — ACETAMINOPHEN 500 MG PO TABS
1000.0000 mg | ORAL_TABLET | Freq: Once | ORAL | Status: AC
  Administered 2015-07-07: 1000 mg via ORAL
  Filled 2015-07-07: qty 2

## 2015-07-07 NOTE — ED Notes (Signed)
Pt reports to the ED for eval of right flank pain x 3 -4 days. Describes it as a nagging pain. She states the pain is worse when she lays down at night. She denies any known injury. Denies any urinary symptoms, numbness, tingling, paralysis, or bowel or bladder changes. Pt A&Ox4, resp e/u, and skin warm and dry.

## 2015-07-07 NOTE — ED Provider Notes (Signed)
CSN: [id number]     Arrival date & time 07/07/15  2043 History   First MD Initiated Contact with Patient 07/07/15 2123     Chief Complaint  Patient presents with  . Flank Pain     (Consider location/radiation/quality/duration/timing/severity/associated sxs/prior Treatment) Patient is a 79 y.o. female presenting with flank pain. The history is provided by the patient.  Flank Pain This is a new problem. The current episode started more than 2 days ago. The problem occurs constantly. The problem has not changed since onset.Pertinent negatives include no abdominal pain and no shortness of breath. Nothing aggravates the symptoms. Nothing relieves the symptoms.    Past Medical History  Diagnosis Date  . Diabetes mellitus without complication   . Hypertension   . Rheumatoid arthritis(714.0)   . Osteoarthritis   . Fibromyalgia   . Obesity   . Chronic neck pain   . Refusal of blood product   . GERD (gastroesophageal reflux disease)   . GI bleed 07/12/2014   Past Surgical History  Procedure Laterality Date  . Joint replacement      bilat hips  . Tubal ligation    . Esophagogastroduodenoscopy N/A 05/27/2014    Procedure: ESOPHAGOGASTRODUODENOSCOPY (EGD);  Surgeon: Milus Banister, MD;  Location: [facility];  Service: Endoscopy;  Laterality: N/A;  . Colonoscopy N/A 05/28/2014    Procedure: COLONOSCOPY;  Surgeon: Juanita Craver, MD;  Location: [facility] ENDOSCOPY;  Service: Endoscopy;  Laterality: N/A;  . Total hip arthroplasty Bilateral    No family history on file. Social History  Substance Use Topics  . Smoking status: Never Smoker   . Smokeless tobacco: Never Used  . Alcohol Use: No   OB History    No data available     Review of Systems  Constitutional: Negative for fever.  Respiratory: Negative for shortness of breath.   Gastrointestinal: Negative for vomiting and abdominal pain.  Genitourinary: Positive for flank pain.  All other systems reviewed and are  negative.     Allergies  Penicillins; Ivp dye; Metoprolol; and Peanut-containing drug products  Home Medications   Prior to Admission medications   Medication Sig Start Date End Date Taking? Authorizing Provider  insulin aspart (NOVOLOG) 100 UNIT/ML injection Inject 5 Units into the skin 3 (three) times daily before meals. 07/15/14  Yes Domenic Polite, MD  ferrous sulfate (FERROUSUL) 325 (65 FE) MG tablet Take 1 tablet (325 mg total) by mouth 2 (two) times daily with a meal. Start after 3-4days Patient not taking: Reported on 07/07/2015 07/15/14   Domenic Polite, MD  insulin glargine (LANTUS) 100 UNIT/ML injection Inject 0.2 mLs (20 Units total) into the skin at bedtime. Patient not taking: Reported on 07/07/2015 07/15/14   Domenic Polite, MD   BP 160/59 mmHg  Pulse 68  Temp(Src) 97.7 F (36.5 C) (Oral)  Resp 20  SpO2 96% Physical Exam  Constitutional: She is oriented to person, place, and time. She appears well-developed and well-nourished. No distress.  HENT:  Head: Normocephalic and atraumatic.  Mouth/Throat: Oropharynx is clear and moist.  Eyes: EOM are normal. Pupils are equal, round, and reactive to light.  Neck: Normal range of motion. Neck supple.  Cardiovascular: Normal rate and regular rhythm.  Exam reveals no friction rub.   No murmur heard. Pulmonary/Chest: Effort normal and breath sounds normal. No respiratory distress. She has no wheezes. She has no rales.  Abdominal: Soft. She exhibits no distension. There is tenderness (RLQ, R flank pain). There is no rebound.  Musculoskeletal: Normal  range of motion. She exhibits no edema.  Neurological: She is alert and oriented to person, place, and time.  Skin: No rash noted. She is not diaphoretic.  Nursing note and vitals reviewed.   ED Course  Procedures (including critical care time) Labs Review Labs Reviewed  COMPREHENSIVE METABOLIC PANEL - Abnormal; Notable for the following:    Chloride 98 (*)    Glucose, Bld 259  (*)    ALT 12 (*)    All other components within normal limits  CBC  LIPASE, BLOOD  URINALYSIS, ROUTINE W REFLEX MICROSCOPIC (NOT AT [facility])    Imaging Review Ct Abdomen Pelvis Wo Contrast  07/08/2015   CLINICAL DATA:  Right lower quadrant abdominal pain  EXAM: CT ABDOMEN AND PELVIS WITHOUT CONTRAST  TECHNIQUE: Multidetector CT imaging of the abdomen and pelvis was performed following the standard protocol without IV contrast.  COMPARISON:  05/26/2014  FINDINGS: BODY WALL: No contributory findings.  LOWER CHEST: There are small pulmonary nodules at the bases seen in the right lower lobe on image 6 and in the left lower lobe on images 10 and 19. These are stable from chest CT twelve days ago when recommendations for follow-up were generated.  ABDOMEN/PELVIS:  Liver: Stable appearance of cysts and probable hepatic steatosis around the gallbladder fossa, evaluated by MRI July 2015  Biliary: No inflammatory changes or calcified stone.  Pancreas: Unremarkable.  Spleen: Unremarkable.  Adrenals: Unremarkable.  Kidneys and ureters: No hydronephrosis or stone. Note that the distal ureters are obscured by hip prostheses. Small cyst exophytic from the lower pole left kidney is confirmed on previous MRI.  Bladder: Partly obscured by hip prostheses.  No pathologic findings.  Reproductive: Partly obscured by hip prostheses. Multiple uterine masses, some calcified, consistent was fibroids. Largest individual fibroid measures on the order of 25 mm.  Bowel: No obstruction. Extensive colonic diverticulosis without active inflammation. No pericecal inflammation.  Retroperitoneum: No mass or adenopathy.  Peritoneum: No ascites or pneumoperitoneum.  Vascular: No acute abnormality. Diffuse atherosclerotic calcification.  OSSEOUS: Advanced degenerative disc and facet disease with mild L5-S1 anterolisthesis. Multilevel foraminal stenoses, most advanced at L5-S1. Bilateral total hip arthroplasty without visible complication.   IMPRESSION: 1. No explanation for acute abdominal pain. 2. Chronic findings are stable from 2015 and noted above.   Electronically Signed   By: Monte Fantasia M.D.   On: 07/08/2015 00:39   I have personally reviewed and evaluated these images and lab results as part of my medical decision-making.   EKG Interpretation None      MDM   Final diagnoses:  UTI (lower urinary tract infection)    74F here with RLQ, R flank pain. Present for 3 days. No fevers, vomiting, diarrhea. No falls. No urinary symptoms. Mildly radiates around the R flank.  Will check labs and scan.  CT negative. Labs show UTI. Will give Bactrim. Labs otherwise ok. Stable for discharge.  Evelina Bucy, MD 07/08/15 [phone]

## 2015-07-08 ENCOUNTER — Emergency Department (HOSPITAL_COMMUNITY): Payer: Medicare Other

## 2015-07-08 DIAGNOSIS — D259 Leiomyoma of uterus, unspecified: Secondary | ICD-10-CM | POA: Diagnosis not present

## 2015-07-08 DIAGNOSIS — K5730 Diverticulosis of large intestine without perforation or abscess without bleeding: Secondary | ICD-10-CM | POA: Diagnosis not present

## 2015-07-08 DIAGNOSIS — N281 Cyst of kidney, acquired: Secondary | ICD-10-CM | POA: Diagnosis not present

## 2015-07-08 MED ORDER — SULFAMETHOXAZOLE-TRIMETHOPRIM 800-160 MG PO TABS: 1.0000 | ORAL_TABLET | Freq: Two times a day (BID) | ORAL | Status: DC

## 2015-07-08 NOTE — ED Notes (Signed)
Pt. Left with all belongings. Discharge instructions were reviewed and all questions were answered.  

## 2015-07-08 NOTE — Discharge Instructions (Signed)

## 2015-08-13 ENCOUNTER — Emergency Department (HOSPITAL_COMMUNITY)
Admission: EM | Admit: 2015-08-13 | Discharge: 2015-08-13 | Disposition: A | Discharge status: Home / Self Care | Payer: Medicare Other | Attending: Emergency Medicine | Admitting: Emergency Medicine

## 2015-08-13 ENCOUNTER — Emergency Department (HOSPITAL_COMMUNITY): Payer: Medicare Other

## 2015-08-13 ENCOUNTER — Encounter (HOSPITAL_COMMUNITY): Payer: Self-pay | Admitting: Emergency Medicine

## 2015-08-13 DIAGNOSIS — R911 Solitary pulmonary nodule: Secondary | ICD-10-CM | POA: Diagnosis not present

## 2015-08-13 DIAGNOSIS — I10 Essential (primary) hypertension: Secondary | ICD-10-CM | POA: Insufficient documentation

## 2015-08-13 DIAGNOSIS — Z794 Long term (current) use of insulin: Secondary | ICD-10-CM | POA: Insufficient documentation

## 2015-08-13 DIAGNOSIS — R918 Other nonspecific abnormal finding of lung field: Secondary | ICD-10-CM

## 2015-08-13 DIAGNOSIS — R1011 Right upper quadrant pain: Secondary | ICD-10-CM | POA: Diagnosis not present

## 2015-08-13 DIAGNOSIS — Z79899 Other long term (current) drug therapy: Secondary | ICD-10-CM | POA: Insufficient documentation

## 2015-08-13 DIAGNOSIS — R109 Unspecified abdominal pain: Secondary | ICD-10-CM

## 2015-08-13 DIAGNOSIS — Z8739 Personal history of other diseases of the musculoskeletal system and connective tissue: Secondary | ICD-10-CM | POA: Insufficient documentation

## 2015-08-13 DIAGNOSIS — E119 Type 2 diabetes mellitus without complications: Secondary | ICD-10-CM | POA: Insufficient documentation

## 2015-08-13 DIAGNOSIS — G8929 Other chronic pain: Secondary | ICD-10-CM | POA: Insufficient documentation

## 2015-08-13 DIAGNOSIS — Z880 Allergy status to penicillin: Secondary | ICD-10-CM | POA: Diagnosis not present

## 2015-08-13 DIAGNOSIS — Z8719 Personal history of other diseases of the digestive system: Secondary | ICD-10-CM | POA: Insufficient documentation

## 2015-08-13 DIAGNOSIS — R52 Pain, unspecified: Secondary | ICD-10-CM

## 2015-08-13 DIAGNOSIS — R1031 Right lower quadrant pain: Secondary | ICD-10-CM | POA: Diagnosis not present

## 2015-08-13 DIAGNOSIS — E669 Obesity, unspecified: Secondary | ICD-10-CM | POA: Insufficient documentation

## 2015-08-13 LAB — CBC WITH DIFFERENTIAL/PLATELET
BASOS ABS: 0 10*3/uL (ref 0.0–0.1)
BASOS PCT: 0 %
Eosinophils Absolute: 0.1 10*3/uL (ref 0.0–0.7)
Eosinophils Relative: 2 %
HEMATOCRIT: 39 % (ref 36.0–46.0)
HEMOGLOBIN: 13.1 g/dL (ref 12.0–15.0)
Lymphocytes Relative: 44 %
Lymphs Abs: 2.4 10*3/uL (ref 0.7–4.0)
MCH: 29.2 pg (ref 26.0–34.0)
MCHC: 33.6 g/dL (ref 30.0–36.0)
MCV: 87.1 fL (ref 78.0–100.0)
MONOS PCT: 5 %
Monocytes Absolute: 0.3 10*3/uL (ref 0.1–1.0)
NEUTROS ABS: 2.7 10*3/uL (ref 1.7–7.7)
NEUTROS PCT: 49 %
Platelets: 203 10*3/uL (ref 150–400)
RBC: 4.48 MIL/uL (ref 3.87–5.11)
RDW: 13.1 % (ref 11.5–15.5)
WBC: 5.6 10*3/uL (ref 4.0–10.5)

## 2015-08-13 LAB — COMPREHENSIVE METABOLIC PANEL
ALT: 11 U/L — ABNORMAL LOW (ref 14–54)
AST: 15 U/L (ref 15–41)
Albumin: 3.5 g/dL (ref 3.5–5.0)
Alkaline Phosphatase: 84 U/L (ref 38–126)
Anion gap: 8 (ref 5–15)
BILIRUBIN TOTAL: 0.8 mg/dL (ref 0.3–1.2)
BUN: 9 mg/dL (ref 6–20)
CO2: 26 mmol/L (ref 22–32)
Calcium: 9 mg/dL (ref 8.9–10.3)
Chloride: 103 mmol/L (ref 101–111)
Creatinine, Ser: 0.78 mg/dL (ref 0.44–1.00)
GFR calc Af Amer: >60 mL/min (ref >60)
GFR calc non Af Amer: >60 mL/min (ref >60)
Glucose, Bld: 252 mg/dL — ABNORMAL HIGH (ref 65–99)
Potassium: 3.9 mmol/L (ref 3.5–5.1)
SODIUM: 137 mmol/L (ref 135–145)
TOTAL PROTEIN: 6.7 g/dL (ref 6.5–8.1)

## 2015-08-13 LAB — URINE MICROSCOPIC-ADD ON

## 2015-08-13 LAB — URINALYSIS, ROUTINE W REFLEX MICROSCOPIC
Bilirubin Urine: NEGATIVE
Glucose, UA: 250 mg/dL — AB
Hgb urine dipstick: NEGATIVE
KETONES UR: NEGATIVE mg/dL
Nitrite: NEGATIVE
PH: 6 (ref 5.0–8.0)
PROTEIN: 100 mg/dL — AB
Specific Gravity, Urine: 1.016 (ref 1.005–1.030)
Urobilinogen, UA: 1 mg/dL (ref 0.0–1.0)

## 2015-08-13 LAB — LIPASE, BLOOD: Lipase: 31 U/L (ref 22–51)

## 2015-08-13 MED ORDER — BARIUM SULFATE 2.1 % PO SUSP
ORAL | Status: AC
  Filled 2015-08-13: qty 2

## 2015-08-13 MED ORDER — HYDROCODONE-ACETAMINOPHEN 5-325 MG PO TABS
1.0000 | ORAL_TABLET | Freq: Once | ORAL | Status: AC
  Administered 2015-08-13: 1 via ORAL
  Filled 2015-08-13: qty 1

## 2015-08-13 MED ORDER — BARIUM SULFATE 2.1 % PO SUSP: ORAL | Status: AC

## 2015-08-13 MED ORDER — HYDROCODONE-ACETAMINOPHEN 5-325 MG PO TABS: 2.0000 | ORAL_TABLET | ORAL | Status: DC | PRN

## 2015-08-13 NOTE — ED Provider Notes (Signed)
CSN: [id number]     Arrival date & time 08/13/15  8416 History   First MD Initiated Contact with Patient 08/13/15 [phone]     Chief Complaint  Patient presents with  . Abdominal Pain  . Flank Pain     (Consider location/radiation/quality/duration/timing/severity/associated sxs/prior Treatment) HPI Comments: Brittany Macdonald is a 79 y.o F with a pmhx of DM, extensive diverticulosis who presents to the Emergency Department today for right sided abdominal pain that began 2 weeks ago. Pt states that the pain remains constant but it worsens after eating. Pain is not alleviated by anything. Pain is located in mid-upper right quadrant of abdomen and travels to R flank. Denies dysuria, hematuria, nausea, vomiting, fevers. Pt has had poor appetite over last 2 weeks and has had about a 15lb unintentional weight loss. Pt has had one episode of diarrhea yesterday, non bloody non bilious. Denies melena.   Patient is a 79 y.o. female presenting with abdominal pain and flank pain. The history is provided by the patient.  Abdominal Pain Flank Pain Associated symptoms include abdominal pain.    Past Medical History  Diagnosis Date  . Diabetes mellitus without complication   . Hypertension   . Rheumatoid arthritis(714.0)   . Osteoarthritis   . Fibromyalgia   . Obesity   . Chronic neck pain   . Refusal of blood product   . GERD (gastroesophageal reflux disease)   . GI bleed 07/12/2014   Past Surgical History  Procedure Laterality Date  . Joint replacement      bilat hips  . Tubal ligation    . Esophagogastroduodenoscopy N/A 05/27/2014    Procedure: ESOPHAGOGASTRODUODENOSCOPY (EGD);  Surgeon: Milus Banister, MD;  Location: [facility];  Service: Endoscopy;  Laterality: N/A;  . Colonoscopy N/A 05/28/2014    Procedure: COLONOSCOPY;  Surgeon: Juanita Craver, MD;  Location[facility] ENDOSCOPY;  Service: Endoscopy;  Laterality: N/A;  . Total hip arthroplasty Bilateral    History reviewed. No pertinent family  history. Social History  Substance Use Topics  . Smoking status: Never Smoker   . Smokeless tobacco: Never Used  . Alcohol Use: No   OB History    No data available     Review of Systems  Gastrointestinal: Positive for abdominal pain.  Genitourinary: Positive for flank pain.  All other systems reviewed and are negative.     Allergies  Penicillins; Ivp dye; Metoprolol; and Peanut-containing drug products  Home Medications   Prior to Admission medications   Medication Sig Start Date End Date Taking? Authorizing Provider  atorvastatin (LIPITOR) 20 MG tablet Take 20 mg by mouth daily. 06/23/15  Yes Historical Provider, MD  insulin aspart (NOVOLOG) 100 UNIT/ML injection Inject 5 Units into the skin 3 (three) times daily before meals. 07/15/14  Yes Domenic Polite, MD  insulin glargine (LANTUS) 100 UNIT/ML injection Inject 0.2 mLs (20 Units total) into the skin at bedtime. Patient taking differently: Inject 10 Units into the skin at bedtime.  07/15/14  Yes Domenic Polite, MD  ferrous sulfate (FERROUSUL) 325 (65 FE) MG tablet Take 1 tablet (325 mg total) by mouth 2 (two) times daily with a meal. Start after 3-4days Patient not taking: Reported on 07/07/2015 07/15/14   Domenic Polite, MD  sulfamethoxazole-trimethoprim (BACTRIM DS,SEPTRA DS) 800-160 MG per tablet Take 1 tablet by mouth 2 (two) times daily. Patient not taking: Reported on 08/13/2015 07/08/15   Evelina Bucy, MD   BP 176/81 mmHg  Pulse 76  Temp(Src) 98.1 F (36.7 C)  Resp  18  Ht 5\' 6"  (1.676 m)  Wt 257 lb (116.574 kg)  BMI 41.50 kg/m2  SpO2 96% Physical Exam  Constitutional: She is oriented to person, place, and time. She appears well-developed and well-nourished. No distress.  HENT:  Head: Normocephalic and atraumatic.  Mouth/Throat: Oropharynx is clear and moist. No oropharyngeal exudate.  Eyes: Conjunctivae and EOM are normal. Pupils are equal, round, and reactive to light. Right eye exhibits no discharge. Left eye  exhibits no discharge. No scleral icterus.  Neck: Normal range of motion. Neck supple.  Cardiovascular: Normal rate, regular rhythm, normal heart sounds and intact distal pulses.  Exam reveals no gallop and no friction rub.   No murmur heard. Pulmonary/Chest: Effort normal and breath sounds normal. No respiratory distress. She has no wheezes. She has no rales. She exhibits no tenderness.  Abdominal: Soft. Bowel sounds are normal. She exhibits no distension and no mass. There is tenderness ( TTP of RUQ. Positive Murphy's sign. ). There is no rebound and no guarding.  Musculoskeletal: Normal range of motion. She exhibits no edema or tenderness.  Lymphadenopathy:    She has no cervical adenopathy.  Neurological: She is alert and oriented to person, place, and time. No cranial nerve deficit.  Strength 5/5 throughout. No sensory deficits.  No gait abnormality.   Skin: Skin is warm and dry. No rash noted. She is not diaphoretic. No erythema. No pallor.  Psychiatric: She has a normal mood and affect. Her behavior is normal.  Nursing note and vitals reviewed.   ED Course  Procedures (including critical care time) Labs Review Labs Reviewed  COMPREHENSIVE METABOLIC PANEL - Abnormal; Notable for the following:    Glucose, Bld 252 (*)    ALT 11 (*)    All other components within normal limits  URINALYSIS, ROUTINE W REFLEX MICROSCOPIC (NOT AT [facility]) - Abnormal; Notable for the following:    APPearance CLOUDY (*)    Glucose, UA 250 (*)    Protein, ur 100 (*)    Leukocytes, UA TRACE (*)    All other components within normal limits  URINE MICROSCOPIC-ADD ON - Abnormal; Notable for the following:    Squamous Epithelial / LPF MANY (*)    All other components within normal limits  LIPASE, BLOOD  CBC WITH DIFFERENTIAL/PLATELET    Imaging Review Ct Abdomen Pelvis Wo Contrast  08/13/2015   CLINICAL DATA:  Patient with right lower quadrant abdominal pain radiating to the right flank. Painful upon  palpation.  EXAM: CT ABDOMEN AND PELVIS WITHOUT CONTRAST  TECHNIQUE: Multidetector CT imaging of the abdomen and pelvis was performed following the standard protocol without IV contrast.  COMPARISON:  CT abdomen pelvis 07/08/2015  FINDINGS: Lower chest: 4 mm left lower lobe pulmonary nodule (image 8; series 2). Additional 2 mm left lobe pulmonary nodule (image 18; series 2). Subpleural atelectasis. Normal heart size.  Hepatobiliary: Liver is normal in size and contour. Fatty deposition adjacent to the gallbladder fossa. Multiple small hepatic cysts. Gallbladder is unremarkable. No intrahepatic or extrahepatic biliary ductal dilatation.  Pancreas: Unremarkable  Spleen: Unremarkable  Adrenals/Urinary Tract: Normal adrenal glands. Kidneys are symmetric in size. Streak artifact limits evaluation the urinary bladder. Small exophytic cyst inferior pole left kidney.  Stomach/Bowel: The sigmoid colon is decompressed. Sigmoid colonic diverticulosis. No CT evidence for acute diverticulitis. No evidence for bowel obstruction. Small hiatal hernia.  Vascular/Lymphatic: Normal caliber abdominal aorta. Peripheral calcified atherosclerotic plaque. No retroperitoneal lymphadenopathy.  Other: Calcified fibroids within the uterus.  Musculoskeletal: Bilateral hip  replacements. Lower lumbar spine degenerative changes. No aggressive or acute appearing osseous lesions.  IMPRESSION: No acute process within the abdomen or pelvis.  Pulmonary nodules within the left lower lobe. Recommend additional follow-up chest CT in 12 months to demonstrate 2 years of stability.   Electronically Signed   By: Lovey Newcomer M.D.   On: 08/13/2015 14:20   US Abdomen Limited Ruq  08/13/2015   CLINICAL DATA:  79 year old female with right upper quadrant abdominal pain for the past 3 weeks.  EXAM: US ABDOMEN LIMITED - RIGHT UPPER QUADRANT  COMPARISON:  CT the abdomen and pelvis 07/08/2015.  FINDINGS: Gallbladder:  No gallstones or wall thickening visualized. No  sonographic Murphy sign noted.  Common bile duct:  Diameter: 3.6 mm  Liver:  Slightly heterogeneous echotexture. 1.9 x 1.5 x 1.9 cm anechoic lesion with increased through transmission in the right lobe of the liver near the gallbladder fossa, compatible with a cyst. No other suspicious hepatic lesion. No intrahepatic biliary ductal dilatation. Hepatopetal flow in the portal vein.  IMPRESSION: 1. No acute findings to account for the patient's symptoms. Specifically, no evidence of gallstones or findings to suggest acute cholecystitis at this time. 2. Additional incidental findings, as above.   Electronically Signed   By: Vinnie Langton M.D.   On: 08/13/2015 11:49   I have personally reviewed and evaluated these images and lab results as part of my medical decision-making.   EKG Interpretation None      MDM   Final diagnoses:  Pain  Abdominal pain, unspecified abdominal location  Pulmonary nodules    Patient seen for right upper quadrant abdominal pain 2 weeks. Pain worsens after eating. Positive Murphy's sign clinical suspicion for cholecystitis. Right upper quadrant ultrasound ordered. Negative for cholecystitis or gallstones. CT abdomen ordered negative for acute abdomen. However internal finding of pulmonary nodules. Recommend follow-up CT scan in 1 year. Discussed this with patient who understands. Patient may require outpatient HIDA scan. Recommend follow-up with gastroenterology. Vital signs stable.Pt stable for discharge.   Return precautions outlined in patient discharge instructions.  Patient was discussed with and seen by Dr. Dayna Barker who agrees with the treatment plan.      Dondra Spry [place], PA-C 08/13/15 1537  Merrily Pew, MD 08/14/15 2225

## 2015-08-13 NOTE — ED Notes (Signed)
Patient transported to Ultrasound 

## 2015-08-13 NOTE — Discharge Instructions (Signed)
Abdominal Pain Many things can cause abdominal pain. Usually, abdominal pain is not caused by a disease and will improve without treatment. It can often be observed and treated at home. Your health care provider will do a physical exam and possibly order blood tests and X-rays to help determine the seriousness of your pain. However, in many cases, more time must pass before a clear cause of the pain can be found. Before that point, your health care provider may not know if you need more testing or further treatment. HOME CARE INSTRUCTIONS  Monitor your abdominal pain for any changes. The following actions may help to alleviate any discomfort you are experiencing:  Only take over-the-counter or prescription medicines as directed by your health care provider.  Do not take laxatives unless directed to do so by your health care provider.  Try a clear liquid diet (broth, tea, or water) as directed by your health care provider. Slowly move to a bland diet as tolerated. SEEK MEDICAL CARE IF:  You have unexplained abdominal pain.  You have abdominal pain associated with nausea or diarrhea.  You have pain when you urinate or have a bowel movement.  You experience abdominal pain that wakes you in the night.  You have abdominal pain that is worsened or improved by eating food.  You have abdominal pain that is worsened with eating fatty foods.  You have a fever. SEEK IMMEDIATE MEDICAL CARE IF:   Your pain does not go away within 2 hours.  You keep throwing up (vomiting).  Your pain is felt only in portions of the abdomen, such as the right side or the left lower portion of the abdomen.  You pass bloody or black tarry stools. MAKE SURE YOU:  Understand these instructions.   Will watch your condition.   Will get help right away if you are not doing well or get worse.  Document Released: 08/14/2005 Document Revised: 11/09/2013 Document Reviewed: 07/14/2013 [facility] Patient Information  2015 [place], [place]. This information is not intended to replace advice given to you by your health care provider. Make sure you discuss any questions you have with your health care provider.  Follow-up with gastroenterology abdominal pain persists longer than 1 week. Return to the emergency department if her symptoms worsen, if you do not bowel movement in greater than 5 days, if he noticed blood in your stool, vomiting, fever.

## 2015-08-13 NOTE — ED Notes (Signed)
Per pt- reports RLQ abdominal pain radiating to right flank for two weeks that is painful upon palpation. Report she was evaluated for this previously and diagnosed with a UTI. Denies irregular stools and denies urinary symptoms.

## 2015-09-28 ENCOUNTER — Encounter: Payer: Self-pay | Admitting: Gastroenterology

## 2015-09-28 NOTE — Telephone Encounter (Signed)
A user error has taken place.

## 2015-11-10 ENCOUNTER — Encounter: Payer: Self-pay | Admitting: Family Medicine

## 2016-05-15 ENCOUNTER — Emergency Department (HOSPITAL_COMMUNITY): Payer: Medicare Other

## 2016-05-15 ENCOUNTER — Encounter (HOSPITAL_COMMUNITY): Payer: Self-pay

## 2016-05-15 ENCOUNTER — Emergency Department (HOSPITAL_COMMUNITY)
Admission: EM | Admit: 2016-05-15 | Discharge: 2016-05-15 | Disposition: A | Discharge status: Home / Self Care | Payer: Medicare Other | Attending: Emergency Medicine | Admitting: Emergency Medicine

## 2016-05-15 DIAGNOSIS — Z794 Long term (current) use of insulin: Secondary | ICD-10-CM | POA: Diagnosis not present

## 2016-05-15 DIAGNOSIS — E119 Type 2 diabetes mellitus without complications: Secondary | ICD-10-CM | POA: Insufficient documentation

## 2016-05-15 DIAGNOSIS — M545 Low back pain: Secondary | ICD-10-CM | POA: Diagnosis present

## 2016-05-15 DIAGNOSIS — M25551 Pain in right hip: Secondary | ICD-10-CM | POA: Insufficient documentation

## 2016-05-15 DIAGNOSIS — I10 Essential (primary) hypertension: Secondary | ICD-10-CM | POA: Diagnosis not present

## 2016-05-15 DIAGNOSIS — M5441 Lumbago with sciatica, right side: Secondary | ICD-10-CM | POA: Diagnosis not present

## 2016-05-15 DIAGNOSIS — Z79899 Other long term (current) drug therapy: Secondary | ICD-10-CM | POA: Diagnosis not present

## 2016-05-15 DIAGNOSIS — Z91010 Allergy to peanuts: Secondary | ICD-10-CM | POA: Insufficient documentation

## 2016-05-15 DIAGNOSIS — Z96643 Presence of artificial hip joint, bilateral: Secondary | ICD-10-CM | POA: Insufficient documentation

## 2016-05-15 DIAGNOSIS — R109 Unspecified abdominal pain: Secondary | ICD-10-CM | POA: Diagnosis not present

## 2016-05-15 LAB — CBC WITH DIFFERENTIAL/PLATELET
BASOS PCT: 0 %
Basophils Absolute: 0 10*3/uL (ref 0.0–0.1)
Eosinophils Absolute: 0.1 10*3/uL (ref 0.0–0.7)
Eosinophils Relative: 2 %
HEMATOCRIT: 43.9 % (ref 36.0–46.0)
HEMOGLOBIN: 14.5 g/dL (ref 12.0–15.0)
LYMPHS ABS: 2.6 10*3/uL (ref 0.7–4.0)
LYMPHS PCT: 43 %
MCH: 29.1 pg (ref 26.0–34.0)
MCHC: 33 g/dL (ref 30.0–36.0)
MCV: 88.2 fL (ref 78.0–100.0)
MONO ABS: 0.2 10*3/uL (ref 0.1–1.0)
MONOS PCT: 4 %
NEUTROS ABS: 3.1 10*3/uL (ref 1.7–7.7)
NEUTROS PCT: 51 %
Platelets: 244 10*3/uL (ref 150–400)
RBC: 4.98 MIL/uL (ref 3.87–5.11)
RDW: 13.2 % (ref 11.5–15.5)
WBC: 6.1 10*3/uL (ref 4.0–10.5)

## 2016-05-15 LAB — COMPREHENSIVE METABOLIC PANEL
ALBUMIN: 4 g/dL (ref 3.5–5.0)
ALK PHOS: 93 U/L (ref 38–126)
ALT: 18 U/L (ref 14–54)
ANION GAP: 8 (ref 5–15)
AST: 21 U/L (ref 15–41)
BILIRUBIN TOTAL: 0.6 mg/dL (ref 0.3–1.2)
BUN: 8 mg/dL (ref 6–20)
CALCIUM: 9.5 mg/dL (ref 8.9–10.3)
CO2: 29 mmol/L (ref 22–32)
Chloride: 100 mmol/L — ABNORMAL LOW (ref 101–111)
Creatinine, Ser: 0.65 mg/dL (ref 0.44–1.00)
GFR calc Af Amer: >60 mL/min (ref >60)
GLUCOSE: 216 mg/dL — AB (ref 65–99)
Potassium: 4.1 mmol/L (ref 3.5–5.1)
Sodium: 137 mmol/L (ref 135–145)
TOTAL PROTEIN: 7.8 g/dL (ref 6.5–8.1)

## 2016-05-15 LAB — LIPASE, BLOOD: LIPASE: 30 U/L (ref 11–51)

## 2016-05-15 MED ORDER — ACETAMINOPHEN 500 MG PO TABS
1000.0000 mg | ORAL_TABLET | Freq: Once | ORAL | Status: AC
  Administered 2016-05-15: 1000 mg via ORAL
  Filled 2016-05-15: qty 2

## 2016-05-15 MED ORDER — ONDANSETRON HCL 4 MG/2ML IJ SOLN
4.0000 mg | Freq: Once | INTRAMUSCULAR | Status: AC
  Administered 2016-05-15: 4 mg via INTRAVENOUS
  Filled 2016-05-15: qty 2

## 2016-05-15 MED ORDER — MORPHINE SULFATE (PF) 2 MG/ML IV SOLN
2.0000 mg | Freq: Once | INTRAVENOUS | Status: AC
  Administered 2016-05-15: 2 mg via INTRAVENOUS
  Filled 2016-05-15: qty 1

## 2016-05-15 NOTE — ED Notes (Signed)
Wheeled pt to restroom from room. 

## 2016-05-15 NOTE — Discharge Instructions (Signed)
Tylenol 1-2 tabs po q4h prn  Sciatica Sciatica is pain, weakness, numbness, or tingling along the path of the sciatic nerve. The nerve starts in the lower back and runs down the back of each leg. The nerve controls the muscles in the lower leg and in the back of the knee, while also providing sensation to the back of the thigh, lower leg, and the sole of your foot. Sciatica is a symptom of another medical condition. For instance, nerve damage or certain conditions, such as a herniated disk or bone spur on the spine, pinch or put pressure on the sciatic nerve. This causes the pain, weakness, or other sensations normally associated with sciatica. Generally, sciatica only affects one side of the body. CAUSES   Herniated or slipped disc.  Degenerative disk disease.  A pain disorder involving the narrow muscle in the buttocks (piriformis syndrome).  Pelvic injury or fracture.  Pregnancy.  Tumor (rare). SYMPTOMS  Symptoms can vary from mild to very severe. The symptoms usually travel from the low back to the buttocks and down the back of the leg. Symptoms can include:  Mild tingling or dull aches in the lower back, leg, or hip.  Numbness in the back of the calf or sole of the foot.  Burning sensations in the lower back, leg, or hip.  Sharp pains in the lower back, leg, or hip.  Leg weakness.  Severe back pain inhibiting movement. These symptoms may get worse with coughing, sneezing, laughing, or prolonged sitting or standing. Also, being overweight may worsen symptoms. DIAGNOSIS  Your caregiver will perform a physical exam to look for common symptoms of sciatica. He or she may ask you to do certain movements or activities that would trigger sciatic nerve pain. Other tests may be performed to find the cause of the sciatica. These may include:  Blood tests.  X-rays.  Imaging tests, such as an MRI or CT scan. TREATMENT  Treatment is directed at the cause of the sciatic pain.  Sometimes, treatment is not necessary and the pain and discomfort goes away on its own. If treatment is needed, your caregiver may suggest:  Over-the-counter medicines to relieve pain.  Prescription medicines, such as anti-inflammatory medicine, muscle relaxants, or narcotics.  Applying heat or ice to the painful area.  Steroid injections to lessen pain, irritation, and inflammation around the nerve.  Reducing activity during periods of pain.  Exercising and stretching to strengthen your abdomen and improve flexibility of your spine. Your caregiver may suggest losing weight if the extra weight makes the back pain worse.  Physical therapy.  Surgery to eliminate what is pressing or pinching the nerve, such as a bone spur or part of a herniated disk. HOME CARE INSTRUCTIONS   Only take over-the-counter or prescription medicines for pain or discomfort as directed by your caregiver.  Apply ice to the affected area for 20 minutes, 3-4 times a day for the first 48-72 hours. Then try heat in the same way.  Exercise, stretch, or perform your usual activities if these do not aggravate your pain.  Attend physical therapy sessions as directed by your caregiver.  Keep all follow-up appointments as directed by your caregiver.  Do not wear high heels or shoes that do not provide proper support.  Check your mattress to see if it is too soft. A firm mattress may lessen your pain and discomfort. SEEK IMMEDIATE MEDICAL CARE IF:   You lose control of your bowel or bladder (incontinence).  You have increasing  weakness in the lower back, pelvis, buttocks, or legs.  You have redness or swelling of your back.  You have a burning sensation when you urinate.  You have pain that gets worse when you lie down or awakens you at night.  Your pain is worse than you have experienced in the past.  Your pain is lasting longer than 4 weeks.  You are suddenly losing weight without reason. MAKE SURE  YOU:  Understand these instructions.  Will watch your condition.  Will get help right away if you are not doing well or get worse.   This information is not intended to replace advice given to you by your health care provider. Make sure you discuss any questions you have with your health care provider.   Document Released: 10/29/2001 Document Revised: 07/26/2015 Document Reviewed: 03/15/2012 Elsevier Interactive Patient Education [employer].

## 2016-05-15 NOTE — ED Notes (Signed)
Patient here with ongoing right hip pain and now unable to ambulate. Had hip replaced a while ago and now metal on metal and cant ambulate, no new trauma

## 2016-05-15 NOTE — ED Provider Notes (Signed)
CSN: [id number]     Arrival date & time 05/15/16  1128 History   First MD Initiated Contact with Patient 05/15/16 1507     Chief Complaint  Patient presents with  . Hip Pain     (Consider location/radiation/quality/duration/timing/severity/associated sxs/prior Treatment) Patient is a 80 y.o. female presenting with hip pain. The history is provided by the patient and a relative.  Hip Pain This is a new problem. The current episode started more than 1 week ago. The problem occurs constantly. The problem has not changed since onset.Associated symptoms include abdominal pain. Pertinent negatives include no chest pain, no headaches and no shortness of breath. The symptoms are aggravated by walking, bending and twisting. Nothing relieves the symptoms. She has tried nothing for the symptoms. The treatment provided no relief.   80 yo F With a chief complaint of right lower back pain. This radiates down her leg. Goes just above the knee. Patient denies any bowel or bladder incontinence denies loss of perirectal sensation. Denies weakness of the right lower extremity. Having pain with standing and having difficulty walking around the house. Started having abdominal pain about 2 days ago. Denies vomiting or diarrhea. Has had some diaphoresis off and on but this is a chronic issue for her.  Past Medical History  Diagnosis Date  . Diabetes mellitus without complication ([place])   . Hypertension   . Rheumatoid arthritis(714.0)   . Osteoarthritis   . Fibromyalgia   . Obesity   . Chronic neck pain   . Refusal of blood product   . GERD (gastroesophageal reflux disease)   . GI bleed 07/12/2014   Past Surgical History  Procedure Laterality Date  . Joint replacement      bilat hips  . Tubal ligation    . Esophagogastroduodenoscopy N/A 05/27/2014    Procedure: ESOPHAGOGASTRODUODENOSCOPY (EGD);  Surgeon: Milus Banister, MD;  Location: [facility];  Service: Endoscopy;  Laterality: N/A;  . Colonoscopy N/A  05/28/2014    Procedure: COLONOSCOPY;  Surgeon: Juanita Craver, MD;  Location: [facility];  Service: Endoscopy;  Laterality: N/A;  . Total hip arthroplasty Bilateral    No family history on file. Social History  Substance Use Topics  . Smoking status: Never Smoker   . Smokeless tobacco: Never Used  . Alcohol Use: No   OB History    No data available     Review of Systems  Constitutional: Negative for fever and chills.  HENT: Negative for congestion and rhinorrhea.   Eyes: Negative for redness and visual disturbance.  Respiratory: Negative for shortness of breath and wheezing.   Cardiovascular: Negative for chest pain and palpitations.  Gastrointestinal: Positive for abdominal pain. Negative for nausea and vomiting.  Genitourinary: Negative for dysuria and urgency.  Musculoskeletal: Positive for back pain and arthralgias. Negative for myalgias.  Skin: Negative for pallor and wound.  Neurological: Negative for dizziness and headaches.      Allergies  Penicillins; Ivp dye; Metoprolol; and Peanut-containing drug products  Home Medications   Prior to Admission medications   Medication Sig Start Date End Date Taking? Authorizing Provider  acetaminophen (TYLENOL) 500 MG tablet Take 250-500 mg by mouth every 6 (six) hours as needed for mild pain or moderate pain.   Yes Historical Provider, MD  insulin aspart (NOVOLOG) 100 UNIT/ML injection Inject 5 Units into the skin 3 (three) times daily before meals. 07/15/14  Yes Domenic Polite, MD  insulin glargine (LANTUS) 100 UNIT/ML injection Inject 0.2 mLs (20 Units total) into the  skin at bedtime. Patient taking differently: Inject 10 Units into the skin at bedtime.  07/15/14  Yes Domenic Polite, MD  Multiple Vitamins-Minerals (ONE-A-DAY WOMENS 50+ ADVANTAGE PO) Take 1 tablet by mouth every morning.   Yes Historical Provider, MD  ferrous sulfate (FERROUSUL) 325 (65 FE) MG tablet Take 1 tablet (325 mg total) by mouth 2 (two) times daily with a  meal. Start after 3-4days Patient not taking: Reported on 07/07/2015 07/15/14   Domenic Polite, MD  HYDROcodone-acetaminophen (NORCO/VICODIN) 5-325 MG per tablet Take 2 tablets by mouth every 4 (four) hours as needed. Patient not taking: Reported on 05/15/2016 08/13/15   Samantha Tripp Dowless, PA-C  sulfamethoxazole-trimethoprim (BACTRIM DS,SEPTRA DS) 800-160 MG per tablet Take 1 tablet by mouth 2 (two) times daily. Patient not taking: Reported on 08/13/2015 07/08/15   Evelina Bucy, MD   BP 137/76 mmHg  Pulse 76  Temp(Src) 97.7 F (36.5 C) (Oral)  Resp 17  SpO2 95% Physical Exam  Constitutional: She is oriented to person, place, and time. She appears well-developed and well-nourished. No distress.  HENT:  Head: Normocephalic and atraumatic.  Eyes: EOM are normal. Pupils are equal, round, and reactive to light.  Neck: Normal range of motion. Neck supple.  Cardiovascular: Normal rate and regular rhythm.  Exam reveals no gallop and no friction rub.   No murmur heard. Pulmonary/Chest: Effort normal. She has no wheezes. She has no rales.  Abdominal: Soft. She exhibits no distension. There is tenderness (worsened the epigastrium and left upper quadrant ). There is no rebound and no guarding.  Musculoskeletal: She exhibits tenderness. She exhibits no edema.  Tender palpation worst about the right piriformis muscle. Negative straight leg raise test bilaterally. Intact pulse motor and sensation.  Neurological: She is alert and oriented to person, place, and time.  Skin: Skin is warm and dry. She is not diaphoretic.  Psychiatric: She has a normal mood and affect. Her behavior is normal.  Nursing note and vitals reviewed.   ED Course  Procedures (including critical care time) Labs Review Labs Reviewed  COMPREHENSIVE METABOLIC PANEL - Abnormal; Notable for the following:    Chloride 100 (*)    Glucose, Bld 216 (*)    All other components within normal limits  CBC WITH DIFFERENTIAL/PLATELET   LIPASE, BLOOD    Imaging Review Ct Renal Stone Study  05/15/2016  CLINICAL DATA:  Right flank pain for several days EXAM: CT ABDOMEN AND PELVIS WITHOUT CONTRAST TECHNIQUE: Multidetector CT imaging of the abdomen and pelvis was performed following the standard protocol without IV contrast. COMPARISON:  August 13, 2015, May 26, 2014 FINDINGS: Lower chest: Mild scar or atelectasis is identified in the bilateral lung bases. There is no focal pneumonia or pleural effusion. The previously noted peripheral left lung base pulmonary nodules are unchanged stable since prior exam of May 26, 2014, no further follow-up is necessary. The heart size normal. Hepatobiliary: There are small low-density liver lesions probably liver cysts unchanged compared prior exam. The gallbladder is normal. The visualized biliary tree is normal. Pancreas: No mass or inflammatory process identified on this un-enhanced exam. Spleen: Within normal limits in size. Adrenals/Urinary Tract: The adrenal glands are normal. There is no nephrolithiasis or hydroureteronephrosis bilaterally. The distal bilateral ureters are not seen due to metallic artifact from the patient's bilateral hip replacements. Previously noted small exophytic cyst in lower pole left kidney is unchanged. The visualized portions of the bladder are normal. Stomach/Bowel: No evidence of obstruction, inflammatory process, or abnormal fluid collections.  There is diverticulosis of the colon without diverticulitis. The appendix is not seen but no inflammatory changes noted around the cecum. Vascular/Lymphatic: No pathologically enlarged lymph nodes. No evidence of abdominal aortic aneurysm. There is atherosclerosis of the abdominal aorta. Reproductive: Calcified uterine fibroid is noted. Other: There is small umbilical herniation of mesenteric fat. Musculoskeletal: Extensive degenerative joint changes of the spine with scoliosis of spine are noted. Bilateral hip replacements are  identified. IMPRESSION: No acute abnormality identified in the abdomen and pelvis. No hydronephrosis is identified bilaterally. Electronically Signed   By: Abelardo Diesel M.D.   On: 05/15/2016 15:48   Dg Hip Unilat With Pelvis 2-3 Views Right  05/15/2016  CLINICAL DATA:  Right low back and hip pain for 3 days. No known injury. EXAM: DG HIP (WITH OR WITHOUT PELVIS) 2-3V RIGHT COMPARISON:  Pelvic CT 08/13/2015 FINDINGS: Status post bilateral total hip arthroplasty. No evidence of prosthetic loosening, acute fracture or dislocation. There is grossly stable periarticular heterotopic ossification bilaterally. Mild sacroiliac degenerative changes and small calcified uterine fibroids are noted. IMPRESSION: No acute findings status post bilateral total hip arthroplasty Electronically Signed   By: Richardean Sale M.D.   On: 05/15/2016 12:37   I have personally reviewed and evaluated these images and lab results as part of my medical decision-making.   EKG Interpretation None      MDM   Final diagnoses:  Acute right-sided low back pain with right-sided sciatica    80 yo F with a chief complaint of right lower back pain. Patient does not have any cauda equina symptoms. Negative straight leg raise test bilaterally. Will obtain a CT stone study to evaluate the size of the aorta and evaluate the abdomen and L spine.   CT negative for acute pathology, labs reassuring.  I suspect this is musculoskeletal back pain, will have her follow up with her PCP.    I have discussed the diagnosis/risks/treatment options with the patient and believe the pt to be eligible for discharge home to follow-up with PCP. We also discussed returning to the ED immediately if new or worsening sx occur. We discussed the sx which are most concerning (e.g., sudden worsening pain, fever, inability to tolerate by mouth) that necessitate immediate return. Medications administered to the patient during their visit and any new prescriptions  provided to the patient are listed below.  Medications given during this visit Medications  morphine 2 MG/ML injection 2 mg (2 mg Intravenous Given 05/15/16 1617)  ondansetron (ZOFRAN) injection 4 mg (4 mg Intravenous Given 05/15/16 1616)  acetaminophen (TYLENOL) tablet 1,000 mg (1,000 mg Oral Given 05/15/16 1616)    Discharge Medication List as of 05/15/2016  5:49 PM      The patient appears reasonably screen and/or stabilized for discharge and I doubt any other medical condition or other [facility] requiring further screening, evaluation, or treatment in the ED at this time prior to discharge.    Deno Etienne, DO 05/16/16 1601

## 2016-06-18 ENCOUNTER — Other Ambulatory Visit: Payer: Self-pay | Admitting: Family Medicine

## 2016-06-18 DIAGNOSIS — J984 Other disorders of lung: Secondary | ICD-10-CM

## 2016-12-17 IMAGING — CT CT CHEST W/O CM
3 of 4 series · 16 of 30 positions shown, 17 images · non-contrast
Comparison: CT chest 05/28/2014

CLINICAL DATA: Lung nodules.

EXAM:
CT CHEST WITHOUT CONTRAST
TECHNIQUE: Multidetector CT imaging of the chest was performed following the
standard protocol without IV contrast.

[Series 3: chest w/o · axial · non-contrast · 0.78mm/px · z∈[-214,-24]mm · 4 of 64 slices shown, 5 images]
[im 13/64  mediastinal]
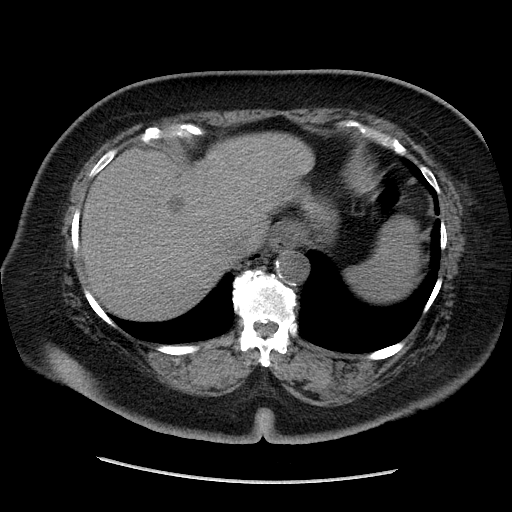
[im 13/64  lung]
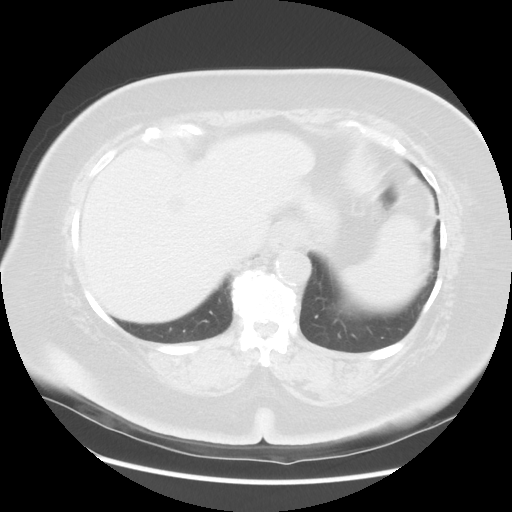
[im 26/64  lung]
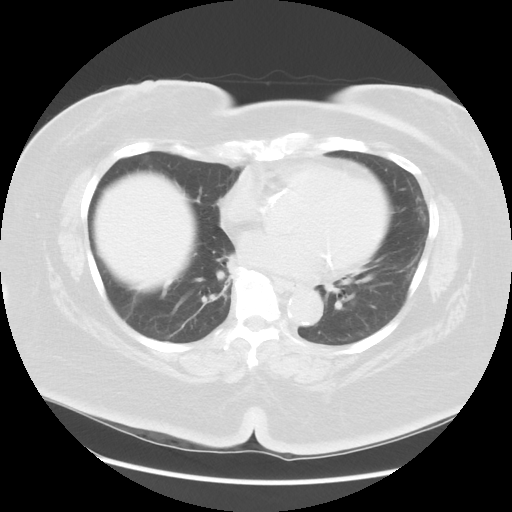
[im 38/64  lung]
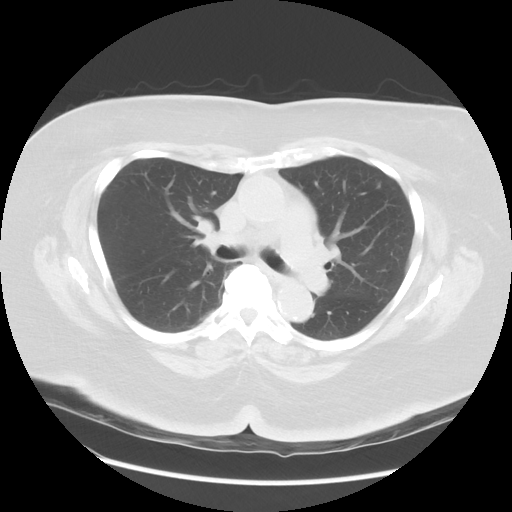
[im 51/64  lung]
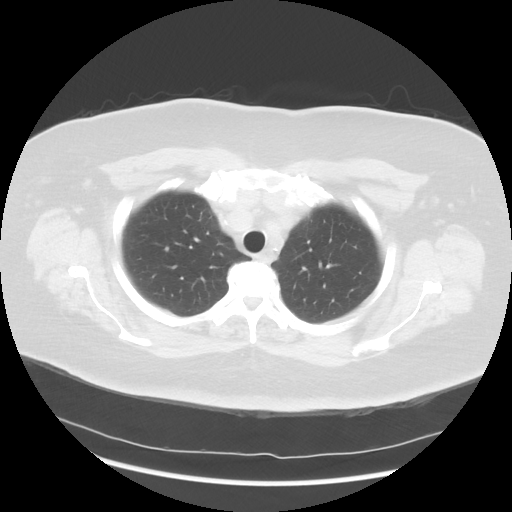

[Series 4: lung windows · axial · 0.78mm/px · z∈[-214,-24]mm · 4 of 64 slices shown]
[im 13/64  lung]
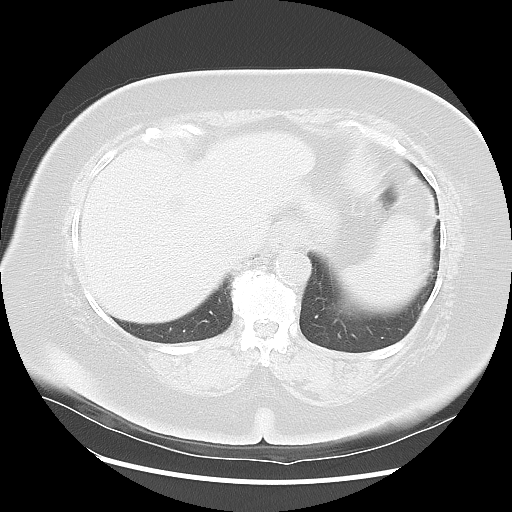
[im 26/64  lung]
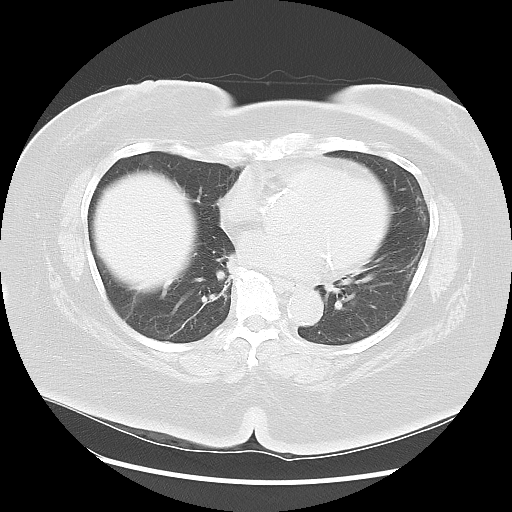
[im 38/64  lung]
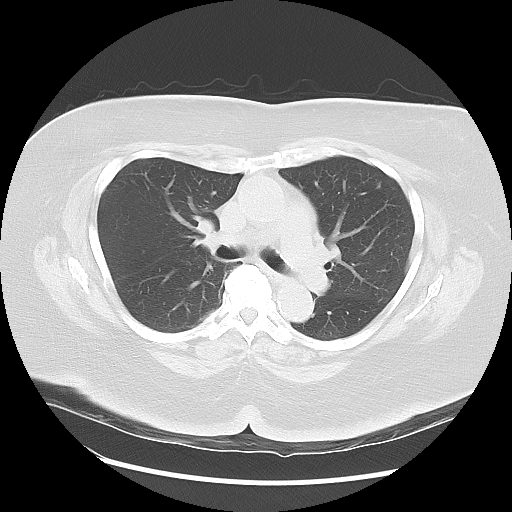
[im 51/64  lung]
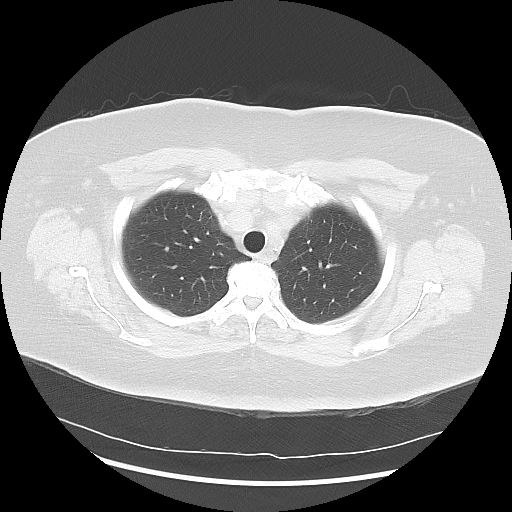

[Series 602: sagittal body · sagittal · 0.78mm/px · 8 of 161 slices shown]
[im 11/161  mediastinal]
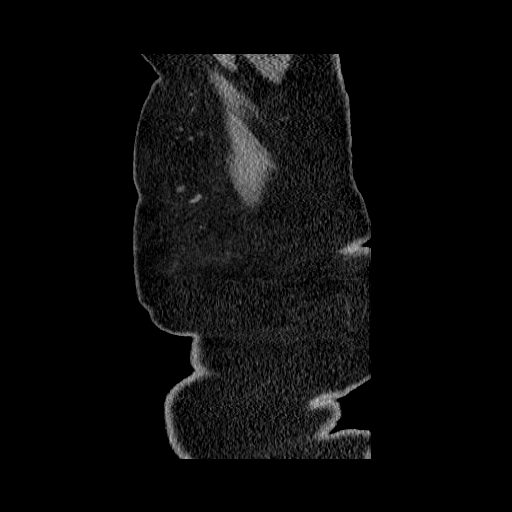
[im 33/161  mediastinal]
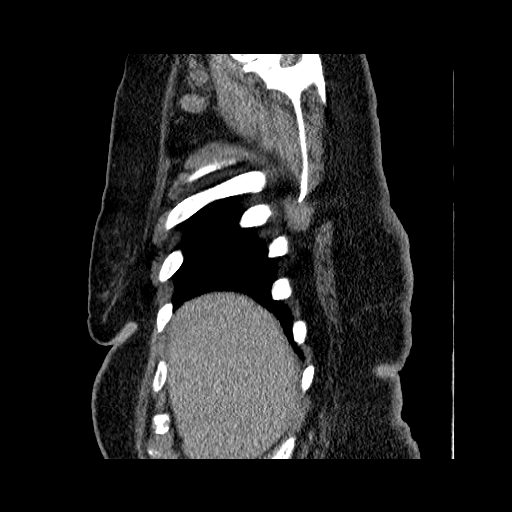
[im 54/161  mediastinal]
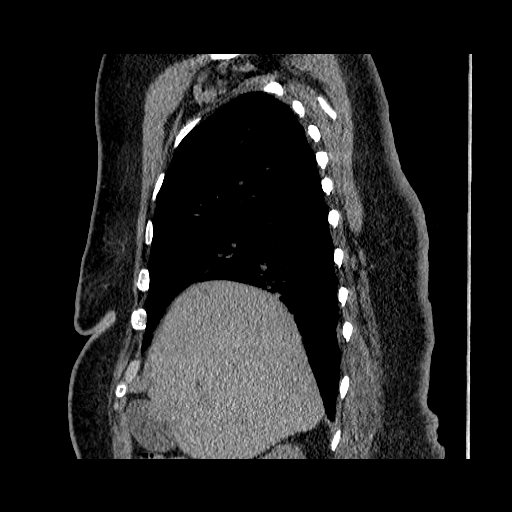
[im 75/161  mediastinal]
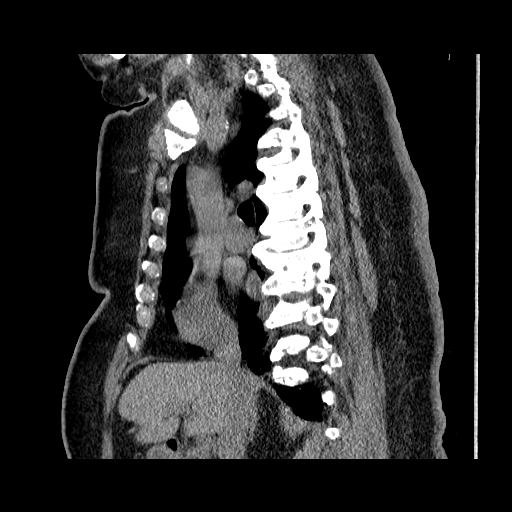
[im 86/161  mediastinal]
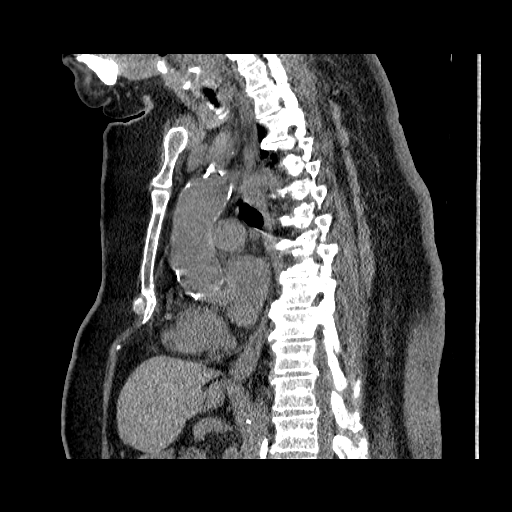
[im 107/161  mediastinal]
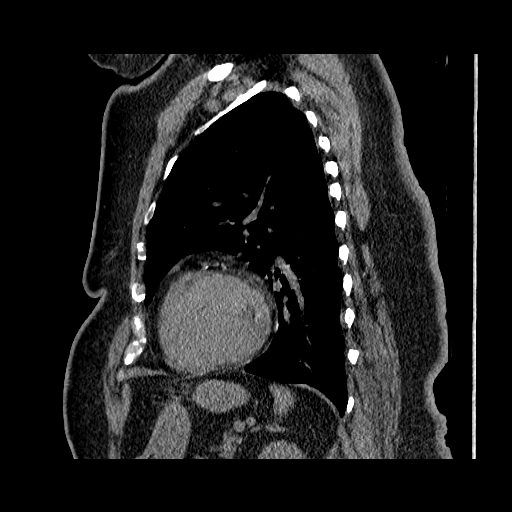
[im 129/161  mediastinal]
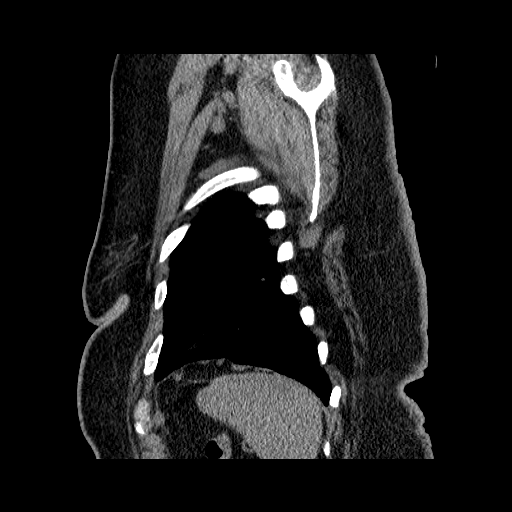
[im 150/161  mediastinal]
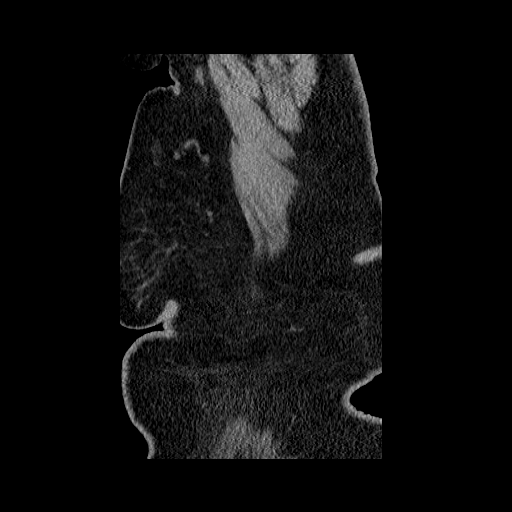

[16 of 30 positions shown; findings below may reference images not displayed]

FINDINGS: Bilateral lung nodules are stable. No enlarging nodule or mass
lesion.

4 mm right apical nodule stable

5 mm nodule right lower lobe stable axial image 30

3 x 5 mm right lower lobe nodule axial image 30 stable

4 mm right middle lobe nodule axial image 38 stable

4 x 6 mm lingular ill-defined density stable

4 mm left lower lobe nodule stable, image 45

Negative for pleural effusion. Atherosclerotic calcification in the
thoracic aorta and coronary arteries. Heart size upper normal.

Low-density lesions in the liver most compatible with cysts. No mass
in the upper abdomen.

No acute spine injury or lesion.
IMPRESSION: Bilateral lung nodules are stable. Documentation of stability for 2
years is recommended. If the patient is at high risk for
bronchogenic carcinoma, follow-up chest CT at 6-12 months is
recommended. If the patient is at low risk for bronchogenic
carcinoma, follow-up chest CT at 12 months is recommended. This
recommendation follows the consensus statement: Guidelines for
Management of Small Pulmonary Nodules Detected on CT Scans: A
Statement from the [HOSPITAL] as published in Radiology

## 2017-04-30 DIAGNOSIS — M179 Osteoarthritis of knee, unspecified: Secondary | ICD-10-CM | POA: Diagnosis not present

## 2017-04-30 DIAGNOSIS — I10 Essential (primary) hypertension: Secondary | ICD-10-CM | POA: Diagnosis not present

## 2017-04-30 DIAGNOSIS — E109 Type 1 diabetes mellitus without complications: Secondary | ICD-10-CM | POA: Diagnosis not present

## 2017-05-01 DIAGNOSIS — E109 Type 1 diabetes mellitus without complications: Secondary | ICD-10-CM | POA: Diagnosis not present

## 2017-05-01 DIAGNOSIS — I10 Essential (primary) hypertension: Secondary | ICD-10-CM | POA: Diagnosis not present

## 2017-08-18 DIAGNOSIS — R404 Transient alteration of awareness: Secondary | ICD-10-CM | POA: Diagnosis not present

## 2017-08-18 DIAGNOSIS — R42 Dizziness and giddiness: Secondary | ICD-10-CM | POA: Diagnosis not present

## 2020-08-16 ENCOUNTER — Encounter: Payer: Self-pay | Admitting: Neurology

## 2020-08-24 ENCOUNTER — Telehealth: Payer: Self-pay | Admitting: Internal Medicine

## 2020-08-24 NOTE — Telephone Encounter (Signed)
Called listed number for patient to offer to schedule a Palliative Consult and someone else answered, this is an incorrect number for patient.  I then called patient's daughter's listed cell number with no answer - unable to leave message due to no voicemail.  I called daughter's listed home number, no answer - left message with reason for call along with my name and call back number, requesting a return call.

## 2020-10-09 ENCOUNTER — Telehealth: Payer: Self-pay

## 2020-10-09 NOTE — Telephone Encounter (Signed)
Attempted to contact patient to schedule palliative care consult appointment. Number for patient is invalid, removed from Epic. Patient's daughter Tonya's mobile number is also invalid. This is the third attempt to reach patient. Will close the referral at this time.

## 2020-10-28 ENCOUNTER — Inpatient Hospital Stay (HOSPITAL_COMMUNITY)
Admission: EM | Admit: 2020-10-28 | Discharge: 2020-11-03 | DRG: 064 | Disposition: A | Discharge status: Skilled Nursing Facility | Payer: Medicare Other | Attending: Internal Medicine | Admitting: Internal Medicine

## 2020-10-28 ENCOUNTER — Other Ambulatory Visit: Payer: Self-pay

## 2020-10-28 ENCOUNTER — Encounter (HOSPITAL_COMMUNITY): Payer: Self-pay

## 2020-10-28 DIAGNOSIS — Z880 Allergy status to penicillin: Secondary | ICD-10-CM

## 2020-10-28 DIAGNOSIS — Z1389 Encounter for screening for other disorder: Secondary | ICD-10-CM

## 2020-10-28 DIAGNOSIS — E1165 Type 2 diabetes mellitus with hyperglycemia: Secondary | ICD-10-CM | POA: Diagnosis present

## 2020-10-28 DIAGNOSIS — G9341 Metabolic encephalopathy: Secondary | ICD-10-CM | POA: Diagnosis present

## 2020-10-28 DIAGNOSIS — K219 Gastro-esophageal reflux disease without esophagitis: Secondary | ICD-10-CM | POA: Diagnosis present

## 2020-10-28 DIAGNOSIS — Z888 Allergy status to other drugs, medicaments and biological substances status: Secondary | ICD-10-CM

## 2020-10-28 DIAGNOSIS — Z91041 Radiographic dye allergy status: Secondary | ICD-10-CM

## 2020-10-28 DIAGNOSIS — Z20822 Contact with and (suspected) exposure to covid-19: Secondary | ICD-10-CM | POA: Diagnosis present

## 2020-10-28 DIAGNOSIS — I63412 Cerebral infarction due to embolism of left middle cerebral artery: Secondary | ICD-10-CM

## 2020-10-28 DIAGNOSIS — I680 Cerebral amyloid angiopathy: Secondary | ICD-10-CM | POA: Diagnosis present

## 2020-10-28 DIAGNOSIS — I5021 Acute systolic (congestive) heart failure: Secondary | ICD-10-CM | POA: Diagnosis present

## 2020-10-28 DIAGNOSIS — E785 Hyperlipidemia, unspecified: Secondary | ICD-10-CM | POA: Diagnosis present

## 2020-10-28 DIAGNOSIS — Z794 Long term (current) use of insulin: Secondary | ICD-10-CM

## 2020-10-28 DIAGNOSIS — E854 Organ-limited amyloidosis: Secondary | ICD-10-CM | POA: Diagnosis present

## 2020-10-28 DIAGNOSIS — E669 Obesity, unspecified: Secondary | ICD-10-CM | POA: Diagnosis present

## 2020-10-28 DIAGNOSIS — Z91010 Allergy to peanuts: Secondary | ICD-10-CM

## 2020-10-28 DIAGNOSIS — E66813 Obesity, class 3: Secondary | ICD-10-CM | POA: Diagnosis present

## 2020-10-28 DIAGNOSIS — I63512 Cerebral infarction due to unspecified occlusion or stenosis of left middle cerebral artery: Principal | ICD-10-CM | POA: Diagnosis present

## 2020-10-28 DIAGNOSIS — I672 Cerebral atherosclerosis: Secondary | ICD-10-CM | POA: Diagnosis present

## 2020-10-28 DIAGNOSIS — R233 Spontaneous ecchymoses: Secondary | ICD-10-CM | POA: Diagnosis present

## 2020-10-28 DIAGNOSIS — Z23 Encounter for immunization: Secondary | ICD-10-CM

## 2020-10-28 DIAGNOSIS — I639 Cerebral infarction, unspecified: Secondary | ICD-10-CM | POA: Diagnosis present

## 2020-10-28 DIAGNOSIS — Z79899 Other long term (current) drug therapy: Secondary | ICD-10-CM

## 2020-10-28 DIAGNOSIS — I110 Hypertensive heart disease with heart failure: Secondary | ICD-10-CM | POA: Diagnosis present

## 2020-10-28 DIAGNOSIS — R4701 Aphasia: Secondary | ICD-10-CM | POA: Diagnosis present

## 2020-10-28 DIAGNOSIS — M797 Fibromyalgia: Secondary | ICD-10-CM | POA: Diagnosis present

## 2020-10-28 DIAGNOSIS — F0390 Unspecified dementia without behavioral disturbance: Secondary | ICD-10-CM | POA: Diagnosis present

## 2020-10-28 DIAGNOSIS — R29898 Other symptoms and signs involving the musculoskeletal system: Secondary | ICD-10-CM | POA: Diagnosis present

## 2020-10-28 DIAGNOSIS — IMO0002 Reserved for concepts with insufficient information to code with codable children: Secondary | ICD-10-CM | POA: Diagnosis present

## 2020-10-28 DIAGNOSIS — G8929 Other chronic pain: Secondary | ICD-10-CM | POA: Diagnosis present

## 2020-10-28 DIAGNOSIS — M069 Rheumatoid arthritis, unspecified: Secondary | ICD-10-CM | POA: Diagnosis present

## 2020-10-28 DIAGNOSIS — R531 Weakness: Secondary | ICD-10-CM | POA: Diagnosis present

## 2020-10-28 DIAGNOSIS — Z96643 Presence of artificial hip joint, bilateral: Secondary | ICD-10-CM | POA: Diagnosis present

## 2020-10-28 LAB — BASIC METABOLIC PANEL
Anion gap: 12 (ref 5–15)
BUN: 17 mg/dL (ref 8–23)
CO2: 25 mmol/L (ref 22–32)
Calcium: 9.1 mg/dL (ref 8.9–10.3)
Chloride: 101 mmol/L (ref 98–111)
Creatinine, Ser: 0.87 mg/dL (ref 0.44–1.00)
GFR, Estimated: >60 mL/min (ref >60)
Glucose, Bld: 263 mg/dL — ABNORMAL HIGH (ref 70–99)
Potassium: 4 mmol/L (ref 3.5–5.1)
Sodium: 138 mmol/L (ref 135–145)

## 2020-10-28 LAB — CBC
HCT: 37.2 % (ref 36.0–46.0)
Hemoglobin: 12.5 g/dL (ref 12.0–15.0)
MCH: 30.4 pg (ref 26.0–34.0)
MCHC: 33.6 g/dL (ref 30.0–36.0)
MCV: 90.5 fL (ref 80.0–100.0)
Platelets: 199 10*3/uL (ref 150–400)
RBC: 4.11 MIL/uL (ref 3.87–5.11)
RDW: 13.1 % (ref 11.5–15.5)
WBC: 8.1 10*3/uL (ref 4.0–10.5)
nRBC: 0 % (ref 0.0–0.2)

## 2020-10-28 NOTE — ED Triage Notes (Signed)
Patient arrived from home by EMS after they were called by family member. Per EMS family reports that patient has had increased weakness x 2 months. Patient has reported dementia and denies any pain and denies complaints. Alert on assessment

## 2020-10-29 ENCOUNTER — Emergency Department (HOSPITAL_COMMUNITY): Payer: Medicare Other

## 2020-10-29 ENCOUNTER — Inpatient Hospital Stay (HOSPITAL_COMMUNITY): Payer: Medicare Other

## 2020-10-29 DIAGNOSIS — G8929 Other chronic pain: Secondary | ICD-10-CM | POA: Diagnosis present

## 2020-10-29 DIAGNOSIS — Z91010 Allergy to peanuts: Secondary | ICD-10-CM | POA: Diagnosis not present

## 2020-10-29 DIAGNOSIS — E1165 Type 2 diabetes mellitus with hyperglycemia: Secondary | ICD-10-CM | POA: Diagnosis present

## 2020-10-29 DIAGNOSIS — R29898 Other symptoms and signs involving the musculoskeletal system: Secondary | ICD-10-CM | POA: Diagnosis not present

## 2020-10-29 DIAGNOSIS — F0390 Unspecified dementia without behavioral disturbance: Secondary | ICD-10-CM | POA: Diagnosis present

## 2020-10-29 DIAGNOSIS — I672 Cerebral atherosclerosis: Secondary | ICD-10-CM

## 2020-10-29 DIAGNOSIS — I10 Essential (primary) hypertension: Secondary | ICD-10-CM | POA: Diagnosis not present

## 2020-10-29 DIAGNOSIS — M797 Fibromyalgia: Secondary | ICD-10-CM | POA: Diagnosis present

## 2020-10-29 DIAGNOSIS — E119 Type 2 diabetes mellitus without complications: Secondary | ICD-10-CM | POA: Diagnosis not present

## 2020-10-29 DIAGNOSIS — M069 Rheumatoid arthritis, unspecified: Secondary | ICD-10-CM | POA: Diagnosis present

## 2020-10-29 DIAGNOSIS — I639 Cerebral infarction, unspecified: Secondary | ICD-10-CM | POA: Diagnosis not present

## 2020-10-29 DIAGNOSIS — Z880 Allergy status to penicillin: Secondary | ICD-10-CM | POA: Diagnosis not present

## 2020-10-29 DIAGNOSIS — I110 Hypertensive heart disease with heart failure: Secondary | ICD-10-CM | POA: Diagnosis present

## 2020-10-29 DIAGNOSIS — Z96643 Presence of artificial hip joint, bilateral: Secondary | ICD-10-CM | POA: Diagnosis present

## 2020-10-29 DIAGNOSIS — Z20822 Contact with and (suspected) exposure to covid-19: Secondary | ICD-10-CM | POA: Diagnosis present

## 2020-10-29 DIAGNOSIS — Z888 Allergy status to other drugs, medicaments and biological substances status: Secondary | ICD-10-CM | POA: Diagnosis not present

## 2020-10-29 DIAGNOSIS — R4701 Aphasia: Secondary | ICD-10-CM | POA: Diagnosis present

## 2020-10-29 DIAGNOSIS — I63412 Cerebral infarction due to embolism of left middle cerebral artery: Secondary | ICD-10-CM | POA: Diagnosis not present

## 2020-10-29 DIAGNOSIS — IMO0002 Reserved for concepts with insufficient information to code with codable children: Secondary | ICD-10-CM | POA: Diagnosis present

## 2020-10-29 DIAGNOSIS — E669 Obesity, unspecified: Secondary | ICD-10-CM | POA: Diagnosis present

## 2020-10-29 DIAGNOSIS — K219 Gastro-esophageal reflux disease without esophagitis: Secondary | ICD-10-CM | POA: Diagnosis present

## 2020-10-29 DIAGNOSIS — E854 Organ-limited amyloidosis: Secondary | ICD-10-CM | POA: Diagnosis present

## 2020-10-29 DIAGNOSIS — Z91041 Radiographic dye allergy status: Secondary | ICD-10-CM | POA: Diagnosis not present

## 2020-10-29 DIAGNOSIS — R531 Weakness: Secondary | ICD-10-CM | POA: Diagnosis present

## 2020-10-29 DIAGNOSIS — I63512 Cerebral infarction due to unspecified occlusion or stenosis of left middle cerebral artery: Secondary | ICD-10-CM | POA: Diagnosis present

## 2020-10-29 DIAGNOSIS — Z23 Encounter for immunization: Secondary | ICD-10-CM | POA: Diagnosis present

## 2020-10-29 DIAGNOSIS — G9341 Metabolic encephalopathy: Secondary | ICD-10-CM | POA: Diagnosis present

## 2020-10-29 DIAGNOSIS — Z794 Long term (current) use of insulin: Secondary | ICD-10-CM | POA: Diagnosis not present

## 2020-10-29 DIAGNOSIS — I5021 Acute systolic (congestive) heart failure: Secondary | ICD-10-CM | POA: Diagnosis present

## 2020-10-29 DIAGNOSIS — Z79899 Other long term (current) drug therapy: Secondary | ICD-10-CM | POA: Diagnosis not present

## 2020-10-29 LAB — CBC WITH DIFFERENTIAL/PLATELET
Abs Immature Granulocytes: 0.02 10*3/uL (ref 0.00–0.07)
Basophils Absolute: 0 10*3/uL (ref 0.0–0.1)
Basophils Relative: 0 %
Eosinophils Absolute: 0.1 10*3/uL (ref 0.0–0.5)
Eosinophils Relative: 1 %
HCT: 38.3 % (ref 36.0–46.0)
Hemoglobin: 12.3 g/dL (ref 12.0–15.0)
Immature Granulocytes: 0 %
Lymphocytes Relative: 24 %
Lymphs Abs: 1.9 10*3/uL (ref 0.7–4.0)
MCH: 29.4 pg (ref 26.0–34.0)
MCHC: 32.1 g/dL (ref 30.0–36.0)
MCV: 91.6 fL (ref 80.0–100.0)
Monocytes Absolute: 0.7 10*3/uL (ref 0.1–1.0)
Monocytes Relative: 9 %
Neutro Abs: 5.2 10*3/uL (ref 1.7–7.7)
Neutrophils Relative %: 66 %
Platelets: 207 10*3/uL (ref 150–400)
RBC: 4.18 MIL/uL (ref 3.87–5.11)
RDW: 13 % (ref 11.5–15.5)
WBC: 8 10*3/uL (ref 4.0–10.5)
nRBC: 0 % (ref 0.0–0.2)

## 2020-10-29 LAB — PROTIME-INR
INR: 1.2 (ref 0.8–1.2)
Prothrombin Time: 14.7 seconds (ref 11.4–15.2)

## 2020-10-29 LAB — TSH: TSH: 1.698 u[IU]/mL (ref 0.350–4.500)

## 2020-10-29 LAB — APTT: aPTT: 35 seconds (ref 24–36)

## 2020-10-29 LAB — RESP PANEL BY RT-PCR (FLU A&B, COVID) ARPGX2
Influenza A by PCR: NEGATIVE
Influenza B by PCR: NEGATIVE
SARS Coronavirus 2 by RT PCR: NEGATIVE

## 2020-10-29 LAB — CBG MONITORING, ED
Glucose-Capillary: 126 mg/dL — ABNORMAL HIGH (ref 70–99)
Glucose-Capillary: 107 mg/dL — ABNORMAL HIGH (ref 70–99)
Glucose-Capillary: 101 mg/dL — ABNORMAL HIGH (ref 70–99)

## 2020-10-29 LAB — ETHANOL: Alcohol, Ethyl (B): <10 mg/dL (ref <10)

## 2020-10-29 MED ORDER — INSULIN GLARGINE 100 UNIT/ML ~~LOC~~ SOLN
10.0000 [IU] | Freq: Every day | SUBCUTANEOUS | Status: DC
  Administered 2020-10-29 – 2020-11-02 (×5): 10 [IU] via SUBCUTANEOUS
  Filled 2020-10-29 (×7): qty 0.1

## 2020-10-29 MED ORDER — INSULIN ASPART 100 UNIT/ML ~~LOC~~ SOLN
0.0000 [IU] | Freq: Three times a day (TID) | SUBCUTANEOUS | Status: DC
  Administered 2020-10-30 – 2020-11-01 (×4): 2 [IU] via SUBCUTANEOUS
  Administered 2020-11-01 – 2020-11-03 (×4): 1 [IU] via SUBCUTANEOUS

## 2020-10-29 MED ORDER — ENOXAPARIN SODIUM 40 MG/0.4ML ~~LOC~~ SOLN
40.0000 mg | Freq: Every day | SUBCUTANEOUS | Status: DC
  Administered 2020-10-29 – 2020-10-30 (×2): 40 mg via SUBCUTANEOUS
  Filled 2020-10-29 (×2): qty 0.4

## 2020-10-29 MED ORDER — ACETAMINOPHEN 160 MG/5ML PO SOLN: 650.0000 mg | ORAL | Status: DC | PRN

## 2020-10-29 MED ORDER — ASPIRIN EC 81 MG PO TBEC
81.0000 mg | DELAYED_RELEASE_TABLET | Freq: Every day | ORAL | Status: DC
  Administered 2020-10-29 – 2020-11-03 (×6): 81 mg via ORAL
  Filled 2020-10-29 (×6): qty 1

## 2020-10-29 MED ORDER — ATORVASTATIN CALCIUM 40 MG PO TABS
40.0000 mg | ORAL_TABLET | Freq: Every day | ORAL | Status: DC
  Administered 2020-10-29 – 2020-11-03 (×6): 40 mg via ORAL
  Filled 2020-10-29 (×6): qty 1

## 2020-10-29 MED ORDER — SODIUM CHLORIDE 0.9 % IV SOLN: INTRAVENOUS | Status: DC

## 2020-10-29 MED ORDER — INSULIN GLARGINE 100 UNIT/ML ~~LOC~~ SOLN: 20.0000 [IU] | Freq: Every day | SUBCUTANEOUS | Status: DC

## 2020-10-29 MED ORDER — ACETAMINOPHEN 325 MG PO TABS
650.0000 mg | ORAL_TABLET | ORAL | Status: DC | PRN
  Administered 2020-11-01 – 2020-11-03 (×5): 650 mg via ORAL
  Filled 2020-10-29 (×5): qty 2

## 2020-10-29 MED ORDER — ACETAMINOPHEN 650 MG RE SUPP: 650.0000 mg | RECTAL | Status: DC | PRN

## 2020-10-29 MED ORDER — SENNOSIDES-DOCUSATE SODIUM 8.6-50 MG PO TABS: 1.0000 | ORAL_TABLET | Freq: Every evening | ORAL | Status: DC | PRN

## 2020-10-29 MED ORDER — STROKE: EARLY STAGES OF RECOVERY BOOK
Freq: Once | Status: DC
  Filled 2020-10-29: qty 1

## 2020-10-29 MED ORDER — LORAZEPAM 2 MG/ML IJ SOLN
0.5000 mg | INTRAMUSCULAR | Status: AC | PRN
  Administered 2020-10-29: 21:00:00 0.5 mg via INTRAVENOUS
  Filled 2020-10-29: qty 1

## 2020-10-29 NOTE — ED Notes (Signed)
Grandaughter updated on status

## 2020-10-29 NOTE — ED Notes (Signed)
Pt daughter called to get an update on mother. Daughter's number is [phone], Briant Sites

## 2020-10-29 NOTE — H&P (Signed)
History and Physical    Brittany Macdonald QQI:[id number] DOB: [birth year] DOA: 10/28/2020  Referring MD/NP/PA: Dorie Rank, MD PCP: [name], MD  Patient coming from: Home via EMS  Chief Complaint: Weakness  I have personally briefly reviewed patient's old medical records in [place]   HPI: Brittany Macdonald is a 84 y.o. female with medical history significant of hypertension, diabetes mellitus type 2, fibromyalgia, GI bleed, GERD, dementia, OA, and RA presents with reports of weakness.  History is obtained mostly from review of records as the patient is unable to provide any significant history.  She reportedly has been having weakness for the last 2 months.  Patient repeats "everything that I took is everything I had" to most questions.  She is able to answer yes and no questions, but denies any complaints at this time except for weakness in the left leg.   ED Course: Upon admission into the emergency department patient was seen to be afebrile with blood pressures 153/107-165/97, and all other vital signs maintained.  Initial CT scan of the brain showed small diffuse cortical atrophy, but no acute abnormalities.  Patient was not a candidate for TPA as there was unclear onset of symptoms.  Labs were relatively unremarkable except for elevated glucose of 263.  Neurology had been consulted to evaluate the patient.  TRH called to admit.  Review of Systems  Unable to perform ROS: Dementia  Respiratory: Negative for shortness of breath.   Cardiovascular: Negative for chest pain.  Neurological: Positive for focal weakness. Negative for headaches.  Psychiatric/Behavioral: Positive for memory loss. Negative for substance abuse.    Past Medical History:  Diagnosis Date  . Chronic neck pain   . Diabetes mellitus without complication ([place])   . Fibromyalgia   . GERD (gastroesophageal reflux disease)   . GI bleed 07/12/2014  . Hypertension   . Obesity   . Osteoarthritis   . Refusal  of blood product   . Rheumatoid arthritis(714.0)     Past Surgical History:  Procedure Laterality Date  . COLONOSCOPY N/A 05/28/2014   Procedure: COLONOSCOPY;  Surgeon: Juanita Craver, MD;  Location: [facility] ENDOSCOPY;  Service: Endoscopy;  Laterality: N/A;  . ESOPHAGOGASTRODUODENOSCOPY N/A 05/27/2014   Procedure: ESOPHAGOGASTRODUODENOSCOPY (EGD);  Surgeon: Milus Banister, MD;  Location: [facility];  Service: Endoscopy;  Laterality: N/A;  . JOINT REPLACEMENT     bilat hips  . TOTAL HIP ARTHROPLASTY Bilateral   . TUBAL LIGATION       reports that she has never smoked. She has never used smokeless tobacco. She reports that she does not drink alcohol and does not use drugs.  Allergies  Allergen Reactions  . Penicillins Anaphylaxis    Has patient had a PCN reaction causing immediate rash, facial/tongue/throat swelling, SOB or lightheadedness with hypotension: Yes Has patient had a PCN reaction causing severe rash involving mucus membranes or skin necrosis: NO Has patient had a PCN reaction that required hospitalization Yes Has patient had a PCN reaction occurring within the last 10 years: No If all of the above answers are "NO", then may proceed with Cephalosporin use.  Clementeen Hoof [Iodinated Diagnostic Agents] Other (See Comments)    Goes into shock  . Metoprolol Other (See Comments)    Headache   . Peanut-Containing Drug Products Other (See Comments)    Rupture stomach     No family history on file.  Prior to Admission medications   Medication Sig Start Date End Date Taking? Authorizing Provider  acetaminophen (TYLENOL) 500 MG tablet Take 250-500 mg by mouth every 6 (six) hours as needed for mild pain or moderate pain.    [provider]  ferrous sulfate (FERROUSUL) 325 (65 FE) MG tablet Take 1 tablet (325 mg total) by mouth 2 (two) times daily with a meal. Start after 3-4days Patient not taking: Reported on 07/07/2015 07/15/14   Domenic Polite, MD  HYDROcodone-acetaminophen  (NORCO/VICODIN) 5-325 MG per tablet Take 2 tablets by mouth every 4 (four) hours as needed. Patient not taking: Reported on 05/15/2016 08/13/15   Dowless, Aldona Bar Tripp, PA-C  insulin aspart (NOVOLOG) 100 UNIT/ML injection Inject 5 Units into the skin 3 (three) times daily before meals. 07/15/14   Domenic Polite, MD  insulin glargine (LANTUS) 100 UNIT/ML injection Inject 0.2 mLs (20 Units total) into the skin at bedtime. Patient taking differently: Inject 10 Units into the skin at bedtime.  07/15/14   Domenic Polite, MD  Multiple Vitamins-Minerals (ONE-A-DAY WOMENS 50+ ADVANTAGE PO) Take 1 tablet by mouth every morning.    [provider]  sulfamethoxazole-trimethoprim (BACTRIM DS,SEPTRA DS) 800-160 MG per tablet Take 1 tablet by mouth 2 (two) times daily. Patient not taking: Reported on 08/13/2015 07/08/15   Evelina Bucy, MD    Physical Exam:  Constitutional: Elderly female who is currently in no acute distress, but has removed her IV from her left arm and all EKG Vitals:   10/28/20 2041 10/29/20 0311 10/29/20 0803 10/29/20 1015  BP: (!) 165/97 (!) 153/107 (!) 161/72 (!) 162/72  Pulse: 98 95 88 88  Resp: 17 18 16 13   Temp:    98.7 F (37.1 C)  TempSrc:    Oral  SpO2: 99% 97% 100% 100%   Eyes: PERRL, lids and conjunctivae normal ENMT: Mucous membranes are moist. Posterior pharynx clear of any exudate or lesions.   Neck: normal, supple, no masses, no thyromegaly Respiratory: clear to auscultation bilaterally, no wheezing, no crackles. Normal respiratory effort. No accessory muscle use.  Cardiovascular: Regular rate and rhythm, no murmurs / rubs / gallops. No extremity edema. 2+ pedal pulses. No carotid bruits.  Abdomen: no tenderness, no masses palpated. No hepatosplenomegaly. Bowel sounds positive.  Musculoskeletal: no clubbing / cyanosis. No joint deformity upper and lower extremities. Good ROM, no contractures. Normal muscle tone.  Skin: no rashes, lesions, ulcers. No  induration Neurologic: CN 2-12 grossly intact. Sensation intact, DTR normal.  Strength 2/5 in the left lower extremity.  Expressive aphasia appreciated. Psychiatric: Alert, but oriented only to self.    Labs on Admission: I have personally reviewed following labs and imaging studies  CBC: Recent Labs  Lab 10/28/20 1749 10/29/20 1043  WBC 8.1 8.0  NEUTROABS  --  5.2  HGB 12.5 12.3  HCT 37.2 38.3  MCV 90.5 91.6  PLT 199 952   Basic Metabolic Panel: Recent Labs  Lab 10/28/20 1749  NA 138  K 4.0  CL 101  CO2 25  GLUCOSE 263*  BUN 17  CREATININE 0.87  CALCIUM 9.1   GFR: CrCl cannot be calculated (Unknown ideal weight.). Liver Function Tests: No results for input(s): AST, ALT, ALKPHOS, BILITOT, PROT, ALBUMIN in the last 168 hours. No results for input(s): LIPASE, AMYLASE in the last 168 hours. No results for input(s): AMMONIA in the last 168 hours. Coagulation Profile: No results for input(s): INR, PROTIME in the last 168 hours. Cardiac Enzymes: No results for input(s): CKTOTAL, CKMB, CKMBINDEX, TROPONINI in the last 168 hours. BNP (last 3 results) No results for input(s):  PROBNP in the last 8760 hours. HbA1C: No results for input(s): HGBA1C in the last 72 hours. CBG: No results for input(s): GLUCAP in the last 168 hours. Lipid Profile: No results for input(s): CHOL, HDL, LDLCALC, TRIG, CHOLHDL, LDLDIRECT in the last 72 hours. Thyroid Function Tests: No results for input(s): TSH, T4TOTAL, FREET4, T3FREE, THYROIDAB in the last 72 hours. Anemia Panel: No results for input(s): VITAMINB12, FOLATE, FERRITIN, TIBC, IRON, RETICCTPCT in the last 72 hours. Urine analysis:    Component Value Date/Time   COLORURINE YELLOW 08/13/2015 0928   APPEARANCEUR CLOUDY (A) 08/13/2015 0928   LABSPEC 1.016 08/13/2015 0928   PHURINE 6.0 08/13/2015 0928   GLUCOSEU 250 (A) 08/13/2015 0928   HGBUR NEGATIVE 08/13/2015 0928   BILIRUBINUR NEGATIVE 08/13/2015 0928   KETONESUR NEGATIVE  08/13/2015 0928   PROTEINUR 100 (A) 08/13/2015 0928   UROBILINOGEN 1.0 08/13/2015 0928   NITRITE NEGATIVE 08/13/2015 0928   LEUKOCYTESUR TRACE (A) 08/13/2015 0928   Sepsis Labs: No results found for this or any previous visit (from the past 240 hour(s)).   Radiological Exams on Admission: CT HEAD WO CONTRAST  Result Date: 10/29/2020 CLINICAL DATA:  Increased weakness. EXAM: CT HEAD WITHOUT CONTRAST TECHNIQUE: Contiguous axial images were obtained from the base of the skull through the vertex without intravenous contrast. COMPARISON:  None. FINDINGS: Brain: Mild diffuse cortical atrophy is noted. Mild chronic ischemic white matter disease is noted. No mass effect or midline shift is noted. Ventricular size is within normal limits. There is no evidence of mass lesion, hemorrhage or acute infarction. Vascular: No hyperdense vessel or unexpected calcification. Skull: Normal. Negative for fracture or focal lesion. Sinuses/Orbits: No acute finding. Other: None. IMPRESSION: Mild diffuse cortical atrophy. Mild chronic ischemic white matter disease. No acute intracranial abnormality seen. Electronically Signed   By: Marijo Conception M.D.   On: 10/29/2020 08:34    EKG: Independently reviewed.  Sinus rhythm at 89 bpm with premature atrial complexes  Assessment/Plan Aphasia and left leg weakness secondary to CVA: Subacute.  Patient presented with expressive aphasia and weakness most notably on the left leg.  Initial CT scan of the brain negative for any acute abnormalities.  Patient was not a TPA candidate given unclear onset.  MRI did reveal found to have abnormal left anterior middle frontal gyrus and anterior operculum suggestive of a subacute infarct with petechial hemorrhage. -Admit to telemetry bed -Stroke order set initiated -Neuro checks -CT angio of the head and neck not obtained due to contrast allergy -Check carotid vascular duplex -Check echocardiogram -Check Hemoglobin A1c and lipid panel  in a.m. -PT/OT/Speech to eval and treat -ASA and statin -Appreciate neurology consultative services, will follow-up for further recommendation -Social work consult   Acute metabolic encephalopathy/dementia: Unclear patient symptoms are related with recent stroke and/or baseline history of dementia. -Add on TSH -Check urinalysis -Continue to monitor   Diabetes mellitus type 2, uncontrolled: Chronic.  Patient presents with glucose elevated up to 263 on admission.  Last available hemoglobin A1c was from 2015 which was 9.6.  Home regimen appeared to include Lantus 20 units nightly and NovoLog 5 units with meals. -Hypoglycemic protocol -Follow-up Hemoglobin A1c -Carb modified diet -CBGs before every meal with sensitive SSI -Reduce Lantus to 10 units nightly adjust regimen as needed  Hyperlipidemia -Follow-up lipid panel -Started atorvastatin 40 mg daily  Morbid obesity: Previously patient's BMI noted to be 40.3 kg/m per -Recheck weight  DVT prophylaxis: Lovenox Code Status: Full Family Communication: Attempted twice to contact daughter  over the phone, but no answer Disposition Plan: To be determined Consults called: Neurology Admission status: Inpatient  Norval Morton MD [facility] Pager [phone]   If 7PM-7AM, please contact night-coverage [email or web address] Password [facility]  10/29/2020, 11:43 AM

## 2020-10-29 NOTE — ED Provider Notes (Signed)
[facility] Provider Note   CSN: [id number] Arrival date & time: 10/28/20  1733     History Chief Complaint  Patient presents with  . Weakness    Brittany Macdonald is a 84 y.o. female.  HPI   Patient presented to the ED for evaluation of weakness.  According to the nursing reports the patient has had increased weakness for the last 2 months.  She does have a history of dementia.  Family members were not at the bedside during my initial assessment.  Patient herself states that she is having trouble.  She is not able to tell me anything other than that.  She keeps on repeating I was having trouble .  she appears to be having some difficulty expressing her thoughts.   Past Medical History:  Diagnosis Date  . Chronic neck pain   . Diabetes mellitus without complication ([place])   . Fibromyalgia   . GERD (gastroesophageal reflux disease)   . GI bleed 07/12/2014  . Hypertension   . Obesity   . Osteoarthritis   . Refusal of blood product   . Rheumatoid arthritis(714.0)     Patient Active Problem List   Diagnosis Date Noted  . Diverticulosis 07/12/2014  . Diarrhea 07/12/2014  . Diabetes mellitus without complication ([place])   . Hypertension   . Osteoarthritis   . Unspecified gastritis and gastroduodenitis without mention of hemorrhage 05/27/2014  . Liver mass 05/27/2014  . GI (gastrointestinal bleed) 05/27/2014  . GI bleeding 05/26/2014  . GI bleed 05/26/2014    Past Surgical History:  Procedure Laterality Date  . COLONOSCOPY N/A 05/28/2014   Procedure: COLONOSCOPY;  Surgeon: Juanita Craver, MD;  Location[facility] ENDOSCOPY;  Service: Endoscopy;  Laterality: N/A;  . ESOPHAGOGASTRODUODENOSCOPY N/A 05/27/2014   Procedure: ESOPHAGOGASTRODUODENOSCOPY (EGD);  Surgeon: Milus Banister, MD;  Location: [facility];  Service: Endoscopy;  Laterality: N/A;  . JOINT REPLACEMENT     bilat hips  . TOTAL HIP ARTHROPLASTY Bilateral   . TUBAL LIGATION        OB History   No obstetric history on file.     No family history on file.  Social History   Tobacco Use  . Smoking status: Never Smoker  . Smokeless tobacco: Never Used  Substance Use Topics  . Alcohol use: No  . Drug use: No    Home Medications Prior to Admission medications   Medication Sig Start Date End Date Taking? Authorizing Provider  acetaminophen (TYLENOL) 500 MG tablet Take 250-500 mg by mouth every 6 (six) hours as needed for mild pain or moderate pain.    [provider]  ferrous sulfate (FERROUSUL) 325 (65 FE) MG tablet Take 1 tablet (325 mg total) by mouth 2 (two) times daily with a meal. Start after 3-4days Patient not taking: Reported on 07/07/2015 07/15/14   Domenic Polite, MD  HYDROcodone-acetaminophen (NORCO/VICODIN) 5-325 MG per tablet Take 2 tablets by mouth every 4 (four) hours as needed. Patient not taking: Reported on 05/15/2016 08/13/15   Dowless, Aldona Bar Tripp, PA-C  insulin aspart (NOVOLOG) 100 UNIT/ML injection Inject 5 Units into the skin 3 (three) times daily before meals. 07/15/14   Domenic Polite, MD  insulin glargine (LANTUS) 100 UNIT/ML injection Inject 0.2 mLs (20 Units total) into the skin at bedtime. Patient taking differently: Inject 10 Units into the skin at bedtime.  07/15/14   Domenic Polite, MD  Multiple Vitamins-Minerals (ONE-A-DAY WOMENS 50+ ADVANTAGE PO) Take 1 tablet by mouth every morning.  [provider]  sulfamethoxazole-trimethoprim (BACTRIM DS,SEPTRA DS) 800-160 MG per tablet Take 1 tablet by mouth 2 (two) times daily. Patient not taking: Reported on 08/13/2015 07/08/15   Evelina Bucy, MD    Allergies    Penicillins, Ivp dye [iodinated diagnostic agents], Metoprolol, and Peanut-containing drug products  Review of Systems   Review of Systems  All other systems reviewed and are negative.   Physical Exam Updated Vital Signs BP (!) 162/72 (BP Location: Right Arm)   Pulse 88   Temp 98.7 F (37.1 C)  (Oral)   Resp 13   SpO2 100%   Physical Exam Vitals and nursing note reviewed.  Constitutional:      General: She is not in acute distress.    Appearance: She is well-developed and well-nourished.  HENT:     Head: Normocephalic and atraumatic.     Right Ear: External ear normal.     Left Ear: External ear normal.     Mouth/Throat:     Mouth: Oropharynx is clear and moist.  Eyes:     General: No scleral icterus.       Right eye: No discharge.        Left eye: No discharge.     Conjunctiva/sclera: Conjunctivae normal.  Neck:     Trachea: No tracheal deviation.  Cardiovascular:     Rate and Rhythm: Normal rate and regular rhythm.     Pulses: Intact distal pulses.  Pulmonary:     Effort: Pulmonary effort is normal. No respiratory distress.     Breath sounds: Normal breath sounds. No stridor. No wheezing or rales.  Abdominal:     General: Bowel sounds are normal. There is no distension.     Palpations: Abdomen is soft.     Tenderness: There is no abdominal tenderness. There is no guarding or rebound.  Musculoskeletal:        General: No tenderness or edema.     Cervical back: Neck supple.  Skin:    General: Skin is warm and dry.     Findings: No rash.  Neurological:     Mental Status: She is alert and oriented to person, place, and time.     Cranial Nerves: Cranial nerve deficit (No facial droop, extraocular movements intact, tongue midline  , aphasia) present.     Sensory: No sensory deficit.     Motor: No abnormal muscle tone or seizure activity.     Coordination: Coordination normal.     Deep Tendon Reflexes: Strength normal.     Comments: No pronator drift bilateral upper extrem, unable to hold both left legs off bed for 5 seconds, sensation intact in all extremities, no visual field cuts, no left or right sided neglect,  no nystagmus noted   Psychiatric:        Mood and Affect: Mood and affect normal.     ED Results / Procedures / Treatments   Labs (all labs  ordered are listed, but only abnormal results are displayed) Labs Reviewed  BASIC METABOLIC PANEL - Abnormal; Notable for the following components:      Result Value   Glucose, Bld 263 (*)    All other components within normal limits  CBC  ETHANOL  CBC WITH DIFFERENTIAL/PLATELET  URINALYSIS, ROUTINE W REFLEX MICROSCOPIC  RAPID URINE DRUG SCREEN, HOSP PERFORMED  URINALYSIS, ROUTINE W REFLEX MICROSCOPIC  PROTIME-INR  APTT    EKG EKG Interpretation  Date/Time:  Saturday October 28 2020 17:39:35 EST Ventricular Rate:  100 PR  Interval:  148 QRS Duration: 80 QT Interval:  338 QTC Calculation: 436 R Axis:   -15 Text Interpretation: Sinus rhythm with Premature supraventricular complexes Minimal voltage criteria for LVH, may be normal variant ( Cornell product ) Anterior infarct , age undetermined ST & T wave abnormality, consider inferior ischemia Abnormal ECG No significant change since last tracing Confirmed by Dorie Rank [id number]) on 10/29/2020 9:20:33 AM   Radiology CT HEAD WO CONTRAST  Result Date: 10/29/2020 CLINICAL DATA:  Increased weakness. EXAM: CT HEAD WITHOUT CONTRAST TECHNIQUE: Contiguous axial images were obtained from the base of the skull through the vertex without intravenous contrast. COMPARISON:  None. FINDINGS: Brain: Mild diffuse cortical atrophy is noted. Mild chronic ischemic white matter disease is noted. No mass effect or midline shift is noted. Ventricular size is within normal limits. There is no evidence of mass lesion, hemorrhage or acute infarction. Vascular: No hyperdense vessel or unexpected calcification. Skull: Normal. Negative for fracture or focal lesion. Sinuses/Orbits: No acute finding. Other: None. IMPRESSION: Mild diffuse cortical atrophy. Mild chronic ischemic white matter disease. No acute intracranial abnormality seen. Electronically Signed   By: Marijo Conception M.D.   On: 10/29/2020 08:34    Procedures Procedures (including critical care  time)  Medications Ordered in ED Medications  LORazepam (ATIVAN) injection 0.5 mg (has no administration in time range)    ED Course  I have reviewed the triage vital signs and the nursing notes.  Pertinent labs & imaging results that were available during my care of the patient were reviewed by me and considered in my medical decision making (see chart for details).  Clinical Course as of 10/29/20 1123  Sun Oct 29, 2020  1033 CT scan does not show any acute findings [JK]  1053 CBC normal, metabolic panel shows hyperglycemia but no electrolyte abnormalities [JK]  1054 Additional history provided by family. Patient has been having issues like this for the past week. Normally she does not have speech difficulties [JK]  1058 Discussed with Dr Leonel Ramsay.  Will consult on pt.  Pt will need MRI.  I will consult medical service. [JK]    Clinical Course User Index [JK] Dorie Rank, MD   MDM Rules/Calculators/A&P                          Patient presented to the ED for evaluation of weakness.  Exact onset is unknown.  Patient lives next to the a.m. to is not here in the ED.  The granddaughter who came to the evaluate the patient at the bedside says that this may have been over the last couple of weeks.  However more recently the patient has had weakness in her leg and was not able to stand.  That is a newer finding.  On exam her finding is concerning for the possibility of an acute stroke.  She has an aphasia and some left-sided leg weakness.  Initial laboratory tests and head CT did not show any definite findings.  I spoke with neurology Dr. Leonel Ramsay.  He will evaluate the patient.  We will plan on MRI for further evaluation.  I will consult the medical service for admission. Final Clinical Impression(s) / ED Diagnoses Final diagnoses:  Aphasia  Weakness      Dorie Rank, MD 10/29/20 1123

## 2020-10-29 NOTE — ED Notes (Signed)
Pt to MRI via stretcher. Neuro NP at bedside

## 2020-10-29 NOTE — Progress Notes (Signed)
Patient arrived from the ED via stretcher; patient stood and transferred to the bed with 2 staff members; unsteady gait; high fall risk.  Patient clean and dry; bed low and locked with bed alarm on; patient is oriented to self; date of birth and understands she is in the hospital.  Report given to [place] at bedside.

## 2020-10-29 NOTE — ED Notes (Signed)
Pt transported back to MRI via stretcher

## 2020-10-29 NOTE — Consult Note (Signed)
Referring Physician: Dr Tomi Bamberger    Reason for Consult: weakness  HPI: Brittany Macdonald is an 84 y.o. female with PMH of dementia, DM, HTN, GERD, rheumatoid arthritis who lives at home with care of family members. She is not able to provide any history or complaint. She is diffusely weak throughout w/o any focal deficits. Her speech is mainly repetitive and she is unable to formulate sentences or ideas. MRI brain was done and showed a subacute left operculum infarct and some associated hemorrhagic conservation. Additional micro-hemorrhages seen as well which is c/w CAA given her dementia.   Date last known well: unknown; several weeks  tPA Given: No, subacute findings with symptoms for weeks to months  Past Medical History Past Medical History:  Diagnosis Date  . Chronic neck pain   . Diabetes mellitus without complication ([place])   . Fibromyalgia   . GERD (gastroesophageal reflux disease)   . GI bleed 07/12/2014  . Hypertension   . Obesity   . Osteoarthritis   . Refusal of blood product   . Rheumatoid arthritis(714.0)     Surgical History Past Surgical History:  Procedure Laterality Date  . COLONOSCOPY N/A 05/28/2014   Procedure: COLONOSCOPY;  Surgeon: Juanita Craver, MD;  Location: [facility] ENDOSCOPY;  Service: Endoscopy;  Laterality: N/A;  . ESOPHAGOGASTRODUODENOSCOPY N/A 05/27/2014   Procedure: ESOPHAGOGASTRODUODENOSCOPY (EGD);  Surgeon: Milus Banister, MD;  Location: [place];  Service: Endoscopy;  Laterality: N/A;  . JOINT REPLACEMENT     bilat hips  . TOTAL HIP ARTHROPLASTY Bilateral   . TUBAL LIGATION      Family History  No family history on file.  Social History:   reports that she has never smoked. She has never used smokeless tobacco. She reports that she does not drink alcohol and does not use drugs.   Home Medications:  (Not in a hospital admission)   Hospital Medications .  stroke: mapping our early stages of recovery book   Does not apply Once  . aspirin EC   81 mg Oral Daily  . atorvastatin  40 mg Oral Daily  . enoxaparin (LOVENOX) injection  40 mg Subcutaneous Daily  . insulin aspart  0-9 Units Subcutaneous TID WC  . insulin glargine  20 Units Subcutaneous QHS    ROS:  Unable; confused   Physical Examination:  Vitals:   10/29/20 1015 10/29/20 1213 10/29/20 1332 10/29/20 1345  BP: (!) 162/72 (!) 170/76 (!) 179/100 (!) 174/94  Pulse: 88 93 85 83  Resp: 13 15 16 11   Temp: 98.7 F (37.1 C) 98.6 F (37 C) 98.5 F (36.9 C)   TempSrc: Oral Oral Oral   SpO2: 100% 99% 98% 100%   General: Appears well-developed; no acute distress, but unable to communicate her needs d/t confusion, dementia Psych: Affect appropriate to situation Eyes: No scleral injection HENT: No OP obstrucion Head: Normocephalic.  Cardiovascular: Normal rate and regular rhythm.  Respiratory: Effort normal and breath sounds normal to anterior ascultation GI: Soft.  No distension. There is no tenderness.  Skin: WDI    Neurological Examination Mental Status: Alert, attends, but unable to follow commands well, with much coaching and mimicking, she is able to cooperate. She is pleasantly confused and only gives her name, she just keeps repeating "Im not sure" for nearly everything I ask. There may be an element of both receptive and expressive aphasia as she cannot name objects either. At baseline she is confused to date/time per family, but would be able to state  her name/dob and converse some.  Cranial Nerves: II: Visual fields grossly normal,  III,IV, VI: ptosis not present, extra-ocular motions intact bilaterally, pupils equal, round, reactive to light and accommodation V,VII: smile symmetric, facial light touch sensation normal bilaterally VIII: hearing normal bilaterally IX,X: uvula rises symmetrically XI: bilateral shoulder shrug XII: midline tongue extension Motor: She is diffusely weak, but seems non-focal; exam is difficult to fully test d/t her lack of  ability to follow all commands.  Tone and bulk:normal tone throughout; no atrophy noted Sensory: Pinprick and light touch intact throughout, bilaterally Deep Tendon Reflexes: 2+ and symmetric throughout Plantars: Right: downgoing   Left: downgoing Cerebellar: normal finger-to-nose, normal rapid alternating movements and normal heel-to-shin test Gait: normal gait and station  NIHSS 1a Level of Conscious.: 0 1b LOC Questions: 2 1c LOC Commands: 2 2 Best Gaze: 0 3 Visual: 0 4 Facial Palsy: 0 5a Motor Arm - Left:1 5b Motor Arm - Right: 1 6a Motor Leg - Left: 1 6b Motor Leg - Right: 1 7 Limb Ataxia: 0 8 Sensory: 0 9 Best Language: 2 10 Dysarthria: 0 11 Extinct. and Inatten.: 0 TOTAL: 10   LABORATORY STUDIES:  Basic Metabolic Panel: Recent Labs  Lab 10/28/20 1749  NA 138  K 4.0  CL 101  CO2 25  GLUCOSE 263*  BUN 17  CREATININE 0.87  CALCIUM 9.1    Liver Function Tests: No results for input(s): AST, ALT, ALKPHOS, BILITOT, PROT, ALBUMIN in the last 168 hours. No results for input(s): LIPASE, AMYLASE in the last 168 hours. No results for input(s): AMMONIA in the last 168 hours.  CBC: Recent Labs  Lab 10/28/20 1749 10/29/20 1043  WBC 8.1 8.0  NEUTROABS  --  5.2  HGB 12.5 12.3  HCT 37.2 38.3  MCV 90.5 91.6  PLT 199 207    Cardiac Enzymes: No results for input(s): CKTOTAL, CKMB, CKMBINDEX, TROPONINI in the last 168 hours.  BNP: Invalid input(s): POCBNP  CBG: Recent Labs  Lab 10/29/20 1207 10/29/20 1314  GLUCAP 126* 107*    Microbiology:   Coagulation Studies: No results for input(s): LABPROT, INR in the last 72 hours.  Urinalysis: No results for input(s): COLORURINE, LABSPEC, PHURINE, GLUCOSEU, HGBUR, BILIRUBINUR, KETONESUR, PROTEINUR, UROBILINOGEN, NITRITE, LEUKOCYTESUR in the last 168 hours.  Invalid input(s): APPERANCEUR  Lipid Panel:  No results found for: CHOL, TRIG, HDL, CHOLHDL, VLDL, LDLCALC  HgbA1C:  Lab Results  Component Value  Date   HGBA1C 9.6 (H) 07/12/2014    Urine Drug Screen:  No results found for: LABOPIA, COCAINSCRNUR, LABBENZ, AMPHETMU, THCU, LABBARB   Alcohol Level:  Recent Labs  Lab 10/29/20 1021  ETH <10   IMAGING: DG Chest 2 View  Result Date: 10/29/2020 CLINICAL DATA:  Screening for metallic implants prior to MRI EXAM: CHEST - 2 VIEW COMPARISON:  06/26/2015 FINDINGS: Heart size is upper limits of normal. Atherosclerotic calcification of the aortic knob. Chronic elevation of the right hemidiaphragm. No focal airspace consolidation, pleural effusion, or pneumothorax. Degenerative changes of the bilateral shoulders. No radiopaque foreign body identified within the chest. IMPRESSION: No active cardiopulmonary disease. No metallic foreign body identified within the chest. Electronically Signed   By: Davina Poke D.O.   On: 10/29/2020 14:57   DG Abd 1 View  Result Date: 10/29/2020 CLINICAL DATA:  Screening for MRI. EXAM: ABDOMEN - 1 VIEW COMPARISON:  Noncontrast abdominopelvic CT 05/15/2016 FINDINGS: Bilateral total hip arthroplasties. No other implanted medical device or radiopaque foreign body to preclude MRI imaging. Normal  bowel gas pattern with moderate volume of stool. Vascular calcifications. Popcorn calcification in the pelvis consistent with phleboliths. IMPRESSION: Bilateral total hip arthroplasties. No other implanted medical device or radiopaque foreign body to preclude MRI imaging. Electronically Signed   By: Keith Rake M.D.   On: 10/29/2020 14:58   CT HEAD WO CONTRAST  Result Date: 10/29/2020 CLINICAL DATA:  Increased weakness. EXAM: CT HEAD WITHOUT CONTRAST TECHNIQUE: Contiguous axial images were obtained from the base of the skull through the vertex without intravenous contrast. COMPARISON:  None. FINDINGS: Brain: Mild diffuse cortical atrophy is noted. Mild chronic ischemic white matter disease is noted. No mass effect or midline shift is noted. Ventricular size is within normal  limits. There is no evidence of mass lesion, hemorrhage or acute infarction. Vascular: No hyperdense vessel or unexpected calcification. Skull: Normal. Negative for fracture or focal lesion. Sinuses/Orbits: No acute finding. Other: None. IMPRESSION: Mild diffuse cortical atrophy. Mild chronic ischemic white matter disease. No acute intracranial abnormality seen. Electronically Signed   By: Marijo Conception M.D.   On: 10/29/2020 08:34   MR BRAIN WO CONTRAST  Result Date: 10/29/2020 CLINICAL DATA:  84 year old female with increased weakness, stroke-like symptoms. EXAM: MRI HEAD WITHOUT CONTRAST TECHNIQUE: Multiplanar, multiecho pulse sequences of the brain and surrounding structures were obtained without intravenous contrast. COMPARISON:  Head CT 0810 hours today. FINDINGS: Brain: No convincing restricted diffusion to suggest acute infarction. Mild susceptibility artifact on DWI in the anterior left middle frontal gyrus related to confluent linear blood products (series 7, image 66) with mild surrounding cerebral edema and/or developing myelomalacia (series 6, image 17). Similar gyral signal abnormality tracking toward the anterior left operculum, also with microhemorrhage (series 7, image 53). Superimposed confluent bilateral cerebral white matter T2 and FLAIR hyperintensity. No other cortical signal changes identified. A few scattered chronic microhemorrhages elsewhere in both hemispheres (such as bilaterally series 7, image 57). No midline shift, mass effect, evidence of mass lesion, ventriculomegaly, extra-axial collection. Cervicomedullary junction and pituitary are within normal limits. Dilated perivascular spaces in the basal ganglia. Otherwise the deep gray nuclei, brainstem and cerebellum remain normal for age. Vascular: Major intracranial vascular flow voids are preserved. Generalized arterial tortuosity. Skull and upper cervical spine: Visible cervical spine degeneration is concordant with age,  including degenerative ligamentous hypertrophy about the odontoid. Visualized bone marrow signal is within normal limits. Sinuses/Orbits: Negative orbits. Paranasal Visualized paranasal sinuses and mastoids are stable and well pneumatized. Other: Grossly normal visible internal auditory structures. IMPRESSION: 1. Abnormal left anterior middle frontal gyrus and anterior operculum most suggestive of a subacute infarct with petechial hemorrhage. No active bleeding or mass effect. 2. No other acute intracranial abnormality. Scattered chronic microhemorrhages elsewhere in the brain, although less than typical of amyloid angiopathy. Advanced bilateral white matter disease. Electronically Signed   By: Genevie Ann M.D.   On: 10/29/2020 15:34    Assessment:  Brittany Macdonald is a 84 y.o. female with history of  dementia, DM, HTN, GERD, rheumatoid arthritis who lives at home with care of family members. She is not able to provide any history or complaint. She is diffusely weak throughout w/o any focal deficits. Her speech is mainly repetitive and she is unable to formulate sentences or ideas. MRI brain was done and showed a subacute left operculum infarct and some associated hemorrhagic conservation. Additional micro-hemorrhages seen as well which is c/w CAA given her dementia.    Stroke: subacute small cortical infarct seen in left operculum with some petechial  hemorrhagic conversion noted. Additionally, there are some micro-hemorrhages seen throughout which could be c/w CAA.  ASA ok, but would use much caution if [place] needed.  Hypertension- goal is normotensive, no role in permissive HTN at this time.  Hyperlipidemia -LDL pending, goal < 100 Dementia- Confounding stroke exam d/t baseline confusion, but does seem to have a mixed aphasia on exam  Plan:  HgbA1c, fasting lipid panel  MRA  of the brain without contrast (pt has contrast allergy so no CTA ordered)  PT consult, OT consult, Speech  consult  Echocardiogram  Carotid dopplers  Prophylactic therapy - Antiplatelet medication(s)  Statin Therapy - LDL goal < 100  Risk factor modification  Telemetry monitoring  Frequent neuro checks  Fall Precautions  DVT prophelaxis  NPO until swallowing evaluation has been passed (aspirin suppository if needed)  Attending Neurologist's note to follow  Alecia Doi Metzger-Cihelka, ARNP-C, ANVP-BC Pager: [phone]

## 2020-10-29 NOTE — ED Notes (Signed)
Pt to Xray via stretcher.

## 2020-10-29 NOTE — ED Notes (Signed)
Complete bed change provided. Fresh linens, fresh brief applied. Purewick placed.

## 2020-10-29 NOTE — ED Notes (Signed)
Granddaughter at bedside

## 2020-10-29 NOTE — ED Notes (Signed)
Spoke with granddaughter called and update given

## 2020-10-30 ENCOUNTER — Inpatient Hospital Stay (HOSPITAL_COMMUNITY): Payer: Medicare Other

## 2020-10-30 ENCOUNTER — Encounter (HOSPITAL_COMMUNITY): Payer: Medicare Other

## 2020-10-30 DIAGNOSIS — I639 Cerebral infarction, unspecified: Secondary | ICD-10-CM

## 2020-10-30 DIAGNOSIS — I5021 Acute systolic (congestive) heart failure: Secondary | ICD-10-CM

## 2020-10-30 LAB — URINALYSIS, ROUTINE W REFLEX MICROSCOPIC
Bilirubin Urine: NEGATIVE
Glucose, UA: NEGATIVE mg/dL
Hgb urine dipstick: NEGATIVE
Ketones, ur: NEGATIVE mg/dL
Nitrite: NEGATIVE
Protein, ur: 30 mg/dL — AB
Specific Gravity, Urine: 1.021 (ref 1.005–1.030)
pH: 5 (ref 5.0–8.0)

## 2020-10-30 LAB — RAPID URINE DRUG SCREEN, HOSP PERFORMED
Amphetamines: NOT DETECTED
Barbiturates: NOT DETECTED
Benzodiazepines: NOT DETECTED
Cocaine: NOT DETECTED
Opiates: NOT DETECTED
Tetrahydrocannabinol: NOT DETECTED

## 2020-10-30 LAB — HEMOGLOBIN A1C
Hgb A1c MFr Bld: 7.4 % — ABNORMAL HIGH (ref 4.8–5.6)
Mean Plasma Glucose: 165.68 mg/dL

## 2020-10-30 LAB — GLUCOSE, CAPILLARY
Glucose-Capillary: 127 mg/dL — ABNORMAL HIGH (ref 70–99)
Glucose-Capillary: 182 mg/dL — ABNORMAL HIGH (ref 70–99)
Glucose-Capillary: 163 mg/dL — ABNORMAL HIGH (ref 70–99)

## 2020-10-30 LAB — ECHOCARDIOGRAM COMPLETE
Area-P 1/2: 2.62 cm2
S' Lateral: 3.1 cm

## 2020-10-30 LAB — LIPID PANEL
Cholesterol: 264 mg/dL — ABNORMAL HIGH (ref 0–200)
HDL: 45 mg/dL (ref >40)
LDL Cholesterol: 201 mg/dL — ABNORMAL HIGH (ref 0–99)
Total CHOL/HDL Ratio: 5.9 RATIO
Triglycerides: 90 mg/dL (ref <150)
VLDL: 18 mg/dL (ref 0–40)

## 2020-10-30 MED ORDER — CLOPIDOGREL BISULFATE 75 MG PO TABS
75.0000 mg | ORAL_TABLET | Freq: Every day | ORAL | Status: DC
  Administered 2020-10-30 – 2020-11-03 (×5): 75 mg via ORAL
  Filled 2020-10-30 (×5): qty 1

## 2020-10-30 MED ORDER — LABETALOL HCL 5 MG/ML IV SOLN
5.0000 mg | INTRAVENOUS | Status: DC | PRN
  Administered 2020-11-01 – 2020-11-02 (×3): 5 mg via INTRAVENOUS
  Filled 2020-10-30 (×3): qty 4

## 2020-10-30 NOTE — Evaluation (Signed)
Physical Therapy Evaluation Patient Details Name: Brittany Macdonald MRN: [id number] DOB: 29-Dec-1935 Today's Date: 10/30/2020   History of Present Illness  pt is 84 y.o. female with PMHx of GERD, RA, DM, HTN, dementia found to have stroke. MRI brain reviewed which showed subacute stroke in left left anterior middle frontal gyrus and anterior operculum associated with linear susceptibility artifact on SWAN likely blood products. MRA showed no large vessel occlusion however diffuse intracranial cerebrovascular atherosclerosis and stenotic disease.    Clinical Impression  Pt admitted with above. Pt with noted expressive aphasia which greatly inhibited patients ability to report prior level of function and home set up. At this time it appears pt is only oriented to person. Pt requiring modA for bed mobility and maxAx2 for OOB transfers. Pt with L sided weakness but also increased fear of falling requiring maxA to advance LEs during std pvt transfer. Attempted to reach daughter to clarify home set up and support however unsuccessful. Recommending CIR upon d/c for to advance mobility to safe level of function to return home. Acute PT to cont to follow.    Follow Up Recommendations CIR    Equipment Recommendations   (TBD at next venue)    Recommendations for Other Services Rehab consult     Precautions / Restrictions Precautions Precautions: Fall Restrictions Weight Bearing Restrictions: No      Mobility  Bed Mobility Overal bed mobility: Needs Assistance Bed Mobility: Supine to Sit     Supine to sit: Mod assist     General bed mobility comments: HOB elevated, instructed to use rail for support to initate, requiring A for transitioning trunk to sitting upright.    Transfers Overall transfer level: Needs assistance Equipment used: 2 person hand held assist Transfers: Stand Pivot Transfers;Sit to/from Stand Sit to Stand: Max assist Stand pivot transfers: Mod assist;+2 physical  assistance       General transfer comment: maxA to power up into standing and achieve full upright posture, pt unable to advance R LE during transfer which increased pts L lateral lean and trunk flexion requiring maxA to advance LEs during std pvt to chair  Ambulation/Gait                Stairs            Wheelchair Mobility    Modified Rankin (Stroke Patients Only)       Balance Overall balance assessment: Needs assistance Sitting-balance support: Feet supported;Single extremity supported   Sitting balance - Comments: good static and forward dynamic, mild lob psoteriorly reqiring min A for correction during functional task   Standing balance support: Bilateral upper extremity supported Standing balance-Leahy Scale: Poor Standing balance comment: dependent on external support                             Pertinent Vitals/Pain Pain Assessment: No/denies pain    Home Living Family/patient expects to be discharged to:: Private residence Living Arrangements: Other (Comment) (unclear, initially saying daughter drops in to visit, then daughter live with her. unable to reach daughter this date.)   Type of Home: House           Additional Comments: per chart pt came from home however pt poor historian and unable to reach daughter for PLOF or house set up    Prior Function Level of Independence: Independent         Comments: No family/caregivers present to confirm social or PLOF,  unable to reach daughter     Hand Dominance   Dominant Hand: Right    Extremity/Trunk Assessment   Upper Extremity Assessment Upper Extremity Assessment: Defer to OT evaluation RUE Deficits / Details: Limitations in R shoulder ROM and PROM at baseline flexion observed to <70 degrees RUE: Unable to fully assess due to immobilization RUE Sensation: WNL RUE Coordination: WNL LUE Deficits / Details: only mild weakness noted to L UE LUE Sensation: WNL LUE  Coordination: decreased fine motor;decreased gross motor    Lower Extremity Assessment Lower Extremity Assessment: LLE deficits/detail;RLE deficits/detail RLE Deficits / Details: generalized weakness LLE Deficits / Details: grossly 3-/5    Cervical / Trunk Assessment Cervical / Trunk Assessment: Kyphotic  Communication   Communication: Expressive difficulties  Cognition Arousal/Alertness: Awake/alert Behavior During Therapy: WFL for tasks assessed/performed Overall Cognitive Status: Impaired/Different from baseline Area of Impairment: Orientation;Following commands;Safety/judgement;Awareness;Problem solving                 Orientation Level: Place;Situation;Time (anticipate limitations also d/t aphasia)     Following Commands: Follows one step commands consistently;Follows multi-step commands inconsistently Safety/Judgement: Decreased awareness of safety;Decreased awareness of deficits   Problem Solving: Slow processing;Requires verbal cues;Requires tactile cues General Comments: pt unable to state month or year stand alone or when given choice, suspect expressive aphasia contributing to this difficulty. Pt poor historian and kept referring to daughter when any question was asked whether it was relevant or not      General Comments General comments (skin integrity, edema, etc.): vss    Exercises     Assessment/Plan    PT Assessment Patient needs continued PT services  PT Problem List Decreased strength;Decreased range of motion;Decreased activity tolerance;Decreased balance;Decreased mobility;Decreased coordination;Decreased cognition;Decreased knowledge of use of DME;Decreased safety awareness       PT Treatment Interventions DME instruction;Gait training;Stair training;Functional mobility training;Therapeutic activities;Therapeutic exercise;Balance training;Neuromuscular re-education    PT Goals (Current goals can be found in the Care Plan section)  Acute Rehab PT  Goals Patient Stated Goal: to get better PT Goal Formulation: With patient Time For Goal Achievement: 11/13/20 Potential to Achieve Goals: Good    Frequency Min 4X/week   Barriers to discharge Other (comment) (unsure of home set up or support)      Co-evaluation PT/OT/SLP Co-Evaluation/Treatment: Yes Reason for Co-Treatment: Complexity of the patient's impairments (multi-system involvement) PT goals addressed during session: Mobility/safety with mobility         AM-PAC PT "6 Clicks" Mobility  Outcome Measure Help needed turning from your back to your side while in a flat bed without using bedrails?: A Little Help needed moving from lying on your back to sitting on the side of a flat bed without using bedrails?: A Lot Help needed moving to and from a bed to a chair (including a wheelchair)?: A Lot Help needed standing up from a chair using your arms (e.g., wheelchair or bedside chair)?: A Lot Help needed to walk in hospital room?: Total Help needed climbing 3-5 steps with a railing? : Total 6 Click Score: 11    End of Session Equipment Utilized During Treatment: Gait belt Activity Tolerance: Patient tolerated treatment well Patient left: in chair;with call bell/phone within reach;with chair alarm set Nurse Communication: Mobility status PT Visit Diagnosis: Unsteadiness on feet (R26.81);Muscle weakness (generalized) (M62.81);Difficulty in walking, not elsewhere classified (R26.2)    Time: 1884-1660 PT Time Calculation (min) (ACUTE ONLY): 31 min   Charges:   PT Evaluation $PT Eval Moderate Complexity: 1 Mod  Kittie Plater, PT, DPT Acute Rehabilitation Services Pager #: [phone] Office #: [phone]   Berline Lopes 10/30/2020, 2:38 PM

## 2020-10-30 NOTE — Progress Notes (Signed)
Echocardiogram 2D Echocardiogram has been performed.  Oneal Deputy Rahm Minix 10/30/2020, 3:14 PM

## 2020-10-30 NOTE — Progress Notes (Addendum)
PROGRESS NOTE  Brittany Macdonald UXL:[id number] DOB: [birth year] DOA: 10/28/2020 PCP: [name], MD  Brief History   84 year old woman PMH including diabetes mellitus type 2, dementia presented with weakness subacute in nature.  Admitted for aphasia and left leg weakness secondary to CVA, subacute.  A & P  Subacute CVA with associated aphasia and left-sided weakness.  MRI showed subacute stroke, microhemorrhages.  MRI no large vessel occlusion.  Imaging suggested CAA.  EKG sinus rhythm, echocardiogram unremarkable --Continue stroke evaluation and management as per stroke service including blood pressure less than 130/90, lipids less than 70 --Follow-up therapy recommendations  Elevated blood pressure.  Normotensive earlier. --As needed labetalol.  May need daily agent if remains elevated.  Dementia suspected baseline --Follow-up TSH  Diabetes mellitus type 2.  Hemoglobin A1c 7.4. --CBG stable.  Continue Lantus, sliding scale insulin.   Disposition Plan:  Discussion: Plan as above  Status is: Inpatient  Needs further stroke evaluation and recommendations.  May need rehab.  Dispo: The patient is from:               Anticipated d/c is to: CIR?              Anticipated d/c date is: 12/14              Patient currently not medically stable  DVT prophylaxis: SCDs Code Status: Full Code Family Communication: none  Murray Hodgkins, MD  [facility] Direct contact: see [email or web address] (further directions at bottom of note if needed) 7PM-7AM contact night coverage as at bottom of note 10/30/2020, 5:58 PM  LOS: 1 day   Significant Hospital Events   .    Consults:  . Neurology    Procedures:  .   Significant Diagnostic Tests:  Marland Kitchen    Micro Data:  .    Antimicrobials:  .   Interval History/Subjective  CC: f/u stroke  Feels ok, no complaints.  Objective   Vitals:  Vitals:   10/30/20 1217 10/30/20 1620  BP: (!) 159/82 (!) 178/78  Pulse: 92 93  Resp: 15 15   Temp: (!) 96.6 F (35.9 C) 98.4 F (36.9 C)  SpO2: 99% 100%    Exam:  Constitutional:   . Appears calm and comfortable ENMT:  . grossly normal hearing  Neck:  . neck appears normal, no masses, normal ROM, supple . no thyromegaly Respiratory:  . CTA bilaterally, no w/r/r.  . Respiratory effort normal.  Cardiovascular:  . RRR, no m/r/g . No LE extremity edema   Psychiatric:  . Mental status o Mood, affect appropriate  I have personally reviewed the following:   Today's Data  . CBG stable . LDL 201 .   Scheduled Meds: .  stroke: mapping our early stages of recovery book   Does not apply Once  . aspirin EC  81 mg Oral Daily  . atorvastatin  40 mg Oral Daily  . enoxaparin (LOVENOX) injection  40 mg Subcutaneous Daily  . insulin aspart  0-9 Units Subcutaneous TID WC  . insulin glargine  10 Units Subcutaneous QHS   Continuous Infusions: . sodium chloride 75 mL/hr at 10/30/20 1014    Principal Problem:   CVA (cerebral vascular accident) [facility]) Active Problems:   Aphasia   Left leg weakness   Diabetes mellitus type 2, uncontrolled ([place])   Acute metabolic encephalopathy   Obesity, Class III, BMI 40-49.9 (morbid obesity) ([place])   LOS: 1 day   How to contact the [employer] Attending or Consulting provider  7A - 7P or covering provider during after hours [place], for this patient?  1. Check the care team in [facility] and look for a) attending/consulting [facility] provider listed and b) the [facility] team listed 2. Log into [email or web address] and use [place]'s universal password to access. If you do not have the password, please contact the hospital operator. 3. Locate the [facility] provider you are looking for under [facility] and page to a number that you can be directly reached. 4. If you still have difficulty reaching the provider, please page the [facility] (Director on Call) for the Hospitalists listed on amion for assistance.

## 2020-10-30 NOTE — Progress Notes (Signed)
Carotid duplex bilateral study completed.   Please see CV Proc for preliminary results.   Dmoni Fortson, RDMS  

## 2020-10-30 NOTE — Progress Notes (Signed)
Pt only oriented times 1. CM has attempted to reach pts daughter without success. Voicemail left. Recommendations are for CIR. TOC following.

## 2020-10-30 NOTE — Plan of Care (Signed)
progressing 

## 2020-10-30 NOTE — Hospital Course (Addendum)
84 year old woman PMH including diabetes mellitus type 2, dementia presented with weakness subacute in nature.  Admitted for aphasia and left leg weakness secondary to CVA, subacute.  A & P  Subacute CVA with associated aphasia and left-sided weakness.  MRI showed subacute stroke, microhemorrhages.  MRI no large vessel occlusion.  Imaging suggested CAA.  EKG sinus rhythm, echocardiogram unremarkable --Goal bloodpressure less than 130/90, lipids less than 70 --Neurology recommended 30-day event monitor to rule out atrial fibrillation --Aspirin and Plavix for 3 weeks then aspirin alone.  Continue statin. --CIR recommended  Elevated blood pressure.   --remains elevated. Will start Norvasc.  Dementia suspected baseline --Appears stable  Diabetes mellitus type 2.  Hemoglobin A1c 7.4. --CBG remained stable.  Will continue Lantus, sliding scale insulin

## 2020-10-30 NOTE — Progress Notes (Signed)
STROKE TEAM PROGRESS NOTE   INTERVAL HISTORY No family is at the bedside.  Patient lying in bed, still has partial expressive aphasia with significant perseveration.  MRI showed subacute left MCA infarct.   Vitals:   10/29/20 2345 10/30/20 0434 10/30/20 0839 10/30/20 1217  BP: (!) 139/95 126/88 (!) 143/90 (!) 159/82  Pulse: 82 73 78 92  Resp: 16  15 15   Temp: 98.4 F (36.9 C) 97.8 F (36.6 C) 97.7 F (36.5 C) (!) 96.6 F (35.9 C)  TempSrc: Oral Oral Oral Axillary  SpO2: 99% 98% 100% 99%   CBC:  Recent Labs  Lab 10/28/20 1749 10/29/20 1043  WBC 8.1 8.0  NEUTROABS  --  5.2  HGB 12.5 12.3  HCT 37.2 38.3  MCV 90.5 91.6  PLT 199 366   Basic Metabolic Panel:  Recent Labs  Lab 10/28/20 1749  NA 138  K 4.0  CL 101  CO2 25  GLUCOSE 263*  BUN 17  CREATININE 0.87  CALCIUM 9.1   Lipid Panel:  Recent Labs  Lab 10/30/20 0440  CHOL 264*  TRIG 90  HDL 45  CHOLHDL 5.9  VLDL 18  LDLCALC 201*   HgbA1c:  Recent Labs  Lab 10/30/20 0440  HGBA1C 7.4*   Urine Drug Screen:  Recent Labs  Lab 10/29/20 1015  LABOPIA NONE DETECTED  COCAINSCRNUR NONE DETECTED  LABBENZ NONE DETECTED  AMPHETMU NONE DETECTED  THCU NONE DETECTED  LABBARB NONE DETECTED    Alcohol Level  Recent Labs  Lab 10/29/20 1021  ETH <10    IMAGING past 24 hours MR ANGIO HEAD WO CONTRAST  Result Date: 10/29/2020 CLINICAL DATA:  84 year old female with increased weakness, stroke-like symptoms. MRI today suggesting subacute left anterior division MCA infarct. EXAM: MRA HEAD WITHOUT CONTRAST TECHNIQUE: Angiographic images of the Circle of Willis were obtained using MRA technique without intravenous contrast. COMPARISON:  Brain MRI 1457 hours today. FINDINGS: Antegrade flow in the posterior circulation. Fairly codominant distal vertebral arteries without stenosis. Both PICA origins are patent. Patent vertebrobasilar junction and basilar artery with mild irregularity and stenosis. Patent SCA and PCA  origins. Moderate left P1, P2, and right P2 PCA stenoses. Furthermore there is duplication of the left PCA by the Pcomm (each supplying either superior or inferior PCA divisions). And there are also moderate to severe stenoses of the distal left Pcomm fairly symmetric distal PCA branch flow signal. Normal left posterior communicating artery origin, the right is diminutive or absent. Antegrade flow in both ICA siphons. Tortuous right siphon just below the skull base. Both ICAs are patent to the terminus with mild to moderate irregularity greater on the left, but only mild left siphon stenosis. Patent MCA and ACA origins. The right A1 is dominant and the left diminutive. Anterior communicating artery is normal. Median artery of the corpus callosum is present (normal variant). There are moderate bilateral ACA A2 stenoses. Preserved distal ACA flow signal. Right MCA M1 and bifurcation are patent without stenosis. Mild right MCA branch irregularity. Left MCA M1 and left MCA bifurcation are patent without stenosis. Moderate irregularity of visible left MCA M3 branches. No discrete branch occlusion identified. IMPRESSION: No large vessel occlusion or Left MCA branch occlusion identified, but widespread intracranial atherosclerosis and stenosis: Moderate stenosis of visible Left MCA M3 branches. Moderate stenosis of bilateral ACA A2 segments. Moderate to severe stenosis of bilateral PCAs. Electronically Signed   By: Genevie Ann M.D.   On: 10/29/2020 18:05   MR BRAIN WO CONTRAST  Result Date: 10/29/2020 CLINICAL DATA:  84 year old female with increased weakness, stroke-like symptoms. EXAM: MRI HEAD WITHOUT CONTRAST TECHNIQUE: Multiplanar, multiecho pulse sequences of the brain and surrounding structures were obtained without intravenous contrast. COMPARISON:  Head CT 0810 hours today. FINDINGS: Brain: No convincing restricted diffusion to suggest acute infarction. Mild susceptibility artifact on DWI in the anterior left  middle frontal gyrus related to confluent linear blood products (series 7, image 66) with mild surrounding cerebral edema and/or developing myelomalacia (series 6, image 17). Similar gyral signal abnormality tracking toward the anterior left operculum, also with microhemorrhage (series 7, image 53). Superimposed confluent bilateral cerebral white matter T2 and FLAIR hyperintensity. No other cortical signal changes identified. A few scattered chronic microhemorrhages elsewhere in both hemispheres (such as bilaterally series 7, image 57). No midline shift, mass effect, evidence of mass lesion, ventriculomegaly, extra-axial collection. Cervicomedullary junction and pituitary are within normal limits. Dilated perivascular spaces in the basal ganglia. Otherwise the deep gray nuclei, brainstem and cerebellum remain normal for age. Vascular: Major intracranial vascular flow voids are preserved. Generalized arterial tortuosity. Skull and upper cervical spine: Visible cervical spine degeneration is concordant with age, including degenerative ligamentous hypertrophy about the odontoid. Visualized bone marrow signal is within normal limits. Sinuses/Orbits: Negative orbits. Paranasal Visualized paranasal sinuses and mastoids are stable and well pneumatized. Other: Grossly normal visible internal auditory structures. IMPRESSION: 1. Abnormal left anterior middle frontal gyrus and anterior operculum most suggestive of a subacute infarct with petechial hemorrhage. No active bleeding or mass effect. 2. No other acute intracranial abnormality. Scattered chronic microhemorrhages elsewhere in the brain, although less than typical of amyloid angiopathy. Advanced bilateral white matter disease. Electronically Signed   By: Genevie Ann M.D.   On: 10/29/2020 15:34    PHYSICAL EXAM  Temp:  [96.6 F (35.9 C)-98.4 F (36.9 C)] 98.4 F (36.9 C) (12/13 1620) Pulse Rate:  [73-93] 93 (12/13 1620) Resp:  [15-16] 15 (12/13 1620) BP:  (126-178)/(78-95) 178/78 (12/13 1620) SpO2:  [98 %-100 %] 100 % (12/13 1620)  General - Well nourished, well developed, in no apparent distress.  Ophthalmologic - fundi not visualized due to noncooperation.  Cardiovascular - Regular rhythm and rate.  Neuro - Awake alert, partial expressive aphasia, able to have spontaneous speech with short sentences but frequent paraphasic errors, significant perseveration on naming test and repetition. However, following all simple commands. No gaze palsy or visual field deficit. No facial droop, tongue midline. LUE at least 4/5 without drift. RUE pain on proximal movement, not able to against gravity. Right hand grip 4-/5. RLE proximal 3-/5 and knee flexion 3/5, toe DF 4/5 PF 5/5. LLE pain on proximal movement, 2-/5, knee flexion 2/5, but toe DF 5/5 PF 5/5. Left FTN intact. Sensation symmetrical. Gait not tested.   ASSESSMENT/PLAN Ms. Brittany Macdonald is a 84 y.o. female with history of GERD, RA, DM, HTN, dementia presenting with diffuse weakness w/o focal deficits. At baseline her speech is perseverative and she is unable to formulate sentences or ideas. MRI positive for stroke.   Stroke:   L MCA infarct w/ petechial hemorrhage, etiology unclear, cardioembolic versus large vessel source  CT head No acute abnormality. Small vessel disease. Atrophy.   MRI  Subacute L anterior middle frontal gyrus and anterior opercular infarct w/ petechial hemorrhage. Scattered microhemorrhages, less typical than CAA. Small vessel disease.   MRA  No LVO. Widespread intracranial atherosclerosis:  Moderate L M3 branches, B A2s. Moderate to severe B PCAs.  Carotid Doppler  Not  able to visualized left ICA  MRA neck pending  2D Echo EF 55-60%  If MRA neck neg, will consider 30 day cardiac event monitoring to rule out afib  LDL 201  HgbA1c 7.4  VTE prophylaxis - Lovenox 40 mg sq daily   No antithrombotic prior to admission, now on aspirin 81 mg daily and plavix  75mg  DAPT for 3 weeks and then ASA alone.   Therapy recommendations:  CIR  Disposition:  pending   Hypertension  Stable . Long-term BP goal normotensive  Hyperlipidemia  Home meds:  No statin  Now on lipitor 40  LDL 201, goal < 70  Continue statin at discharge  Diabetes type II Uncontrolled  HgbA1c 7.4, goal < 7.0  CBGs   SSI  Close PCP follow-up for better DM control  Other Stroke Risk Factors  Advanced Age >/= 64   Morbid Obesity, BMI >/= 30 associated with increased stroke risk, recommend weight loss, diet and exercise as appropriate   Other Active Problems  Severe baseline dementia  Fibromyalgia   GERD  Hospital day # 1  Rosalin Hawking, MD PhD Stroke Neurology 10/30/2020 8:30 PM    To contact Stroke Continuity provider, please refer to [email or web address] After hours, contact General Neurology

## 2020-10-30 NOTE — Progress Notes (Signed)
Occupational Therapy Evaluation Patient Details Name: Brittany Macdonald MRN: [id number] DOB: 1936/09/29 Today's Date: 10/30/2020    History of Present Illness pt is 84 y.o. female with PMHx of GERD, RA, DM, HTN, dementia found to have stroke. MRI brain reviewed which showed subacute stroke in left left anterior middle frontal gyrus and anterior operculum associated with linear susceptibility artifact on SWAN likely blood products. MRA showed no large vessel occlusion however diffuse intracranial cerebrovascular atherosclerosis and stenotic disease.   Clinical Impression   Pt pleasant and with excellent participation this date; persistent expressive difficulty improved multiple choice for identification purposes. Mild L hemi weakness leading to increased A at this time for ADL's and transfers. Family not present and OT unable to reach daughter when called for confirmation of PLOF or social which pt endorses she was fully indep, noted in chart pt with functional decline over last 2 months. At this time pt requiring mod A for bed mobility, mod +2 for safe transfer bed>recliner, and at least min-mod A for all ADL's limited by deficits listed below. Will benefit from post acute OT listed below, with OT to continue to follow acutely.     Follow Up Recommendations  CIR;Supervision/Assistance - 24 hour    Equipment Recommendations       Recommendations for Other Services Rehab consult     Precautions / Restrictions Precautions Precautions: Fall Restrictions Weight Bearing Restrictions: No      Mobility Bed Mobility Overal bed mobility: Needs Assistance Bed Mobility: Supine to Sit     Supine to sit: Mod assist     General bed mobility comments: HOB elevated, instructed to use rail for support to initate, requiring A for transitioning trunk to sitting upright.    Transfers Overall transfer level: Needs assistance Equipment used: 2 person hand held assist Transfers: Stand Pivot  Transfers;Sit to/from Stand Sit to Stand: Max assist Stand pivot transfers: Mod assist;+2 physical assistance       General transfer comment: A for weight shifting to advance LE's during transfer, increased fear of falling    Balance Overall balance assessment: Needs assistance Sitting-balance support: Feet supported;Single extremity supported   Sitting balance - Comments: good static and forward dynamic, mild lob psoteriorly reqiring min A for correction during functional task   Standing balance support: Bilateral upper extremity supported                               ADL either performed or assessed with clinical judgement   ADL Overall ADL's : Needs assistance/impaired Eating/Feeding: Set up;Sitting   Grooming: Wash/dry face;Supervision/safety;Sitting           Upper Body Dressing : Minimal assistance;Sitting   Lower Body Dressing: Sit to/from stand;Maximal assistance;+2 for physical assistance Lower Body Dressing Details (indicate cue type and reason): demos ability to participate with seated LB ADL's with forward flexion to feet, but with standing, need for +2 for safety d/t inability to safely remove UE support from surface in standing for clothing management. simualted. Toilet Transfer: Moderate assistance;+2 for physical assistance   Toileting- Clothing Manipulation and Hygiene: Maximal assistance;Cueing for sequencing;Cueing for compensatory techniques         General ADL Comments: will require confirmation of baseline with family once present or reachable. limited significant by aphsia     Vision Baseline Vision/History: Wears glasses Vision Assessment?: No apparent visual deficits     Perception Perception Perception Tested?: No   Praxis  Pertinent Vitals/Pain Pain Assessment: No/denies pain     Hand Dominance Right   Extremity/Trunk Assessment Upper Extremity Assessment Upper Extremity Assessment: LUE deficits/detail;RUE  deficits/detail RUE Deficits / Details: Limitations in R shoulder ROM and PROM at baseline flexion observed to <70 degrees RUE: Unable to fully assess due to immobilization RUE Sensation: WNL RUE Coordination: WNL LUE Deficits / Details: only mild weakness noted to L UE LUE Sensation: WNL LUE Coordination: decreased fine motor;decreased gross motor   Lower Extremity Assessment Lower Extremity Assessment: Defer to PT evaluation   Cervical / Trunk Assessment Cervical / Trunk Assessment: Kyphotic   Communication Communication Communication: Expressive difficulties   Cognition Arousal/Alertness: Awake/alert Behavior During Therapy: WFL for tasks assessed/performed Overall Cognitive Status: Impaired/Different from baseline Area of Impairment: Orientation;Following commands;Safety/judgement;Awareness                 Orientation Level: Place;Situation;Time (anticipate limitations also d/t aphasia)     Following Commands: Follows one step commands consistently Safety/Judgement: Decreased awareness of safety;Decreased awareness of deficits         General Comments  no reports of dizziness, mild fatigue observed    Exercises     Shoulder Instructions      Home Living Family/patient expects to be discharged to:: Private residence Living Arrangements: Other (Comment) (unclear, initially saying daughter drops in to visit, then daughter live with her. unable to reach daughter this date.)   Type of Home: House                                  Prior Functioning/Environment Level of Independence: Independent        Comments: No family/caregivers present to confirm social or PLOF        OT Problem List: Decreased strength;Decreased range of motion;Decreased activity tolerance;Impaired balance (sitting and/or standing);Decreased coordination;Decreased cognition;Decreased safety awareness;Decreased knowledge of use of DME or AE;Decreased knowledge of  precautions      OT Treatment/Interventions: Self-care/ADL training;Therapeutic exercise;Neuromuscular education;DME and/or AE instruction;Manual therapy;Therapeutic activities;Cognitive remediation/compensation;Visual/perceptual remediation/compensation;Balance training;Patient/family education    OT Goals(Current goals can be found in the care plan section) Acute Rehab OT Goals Patient Stated Goal: to get better OT Goal Formulation: With patient Time For Goal Achievement: 11/13/20 Potential to Achieve Goals: Good ADL Goals Pt Will Perform Grooming: with supervision;sitting;standing Pt Will Perform Upper Body Dressing: with supervision Pt Will Perform Lower Body Dressing: with supervision Pt Will Transfer to Toilet: with supervision;bedside commode;grab bars;regular height toilet Pt Will Perform Toileting - Clothing Manipulation and hygiene: with supervision;with adaptive equipment;sit to/from stand  OT Frequency: Min 2X/week   Barriers to D/C:    will need to cofirm social support at time of d/c.       Co-evaluation              AM-PAC OT "6 Clicks" Daily Activity     Outcome Measure Help from another person eating meals?: A Little Help from another person taking care of personal grooming?: A Little Help from another person toileting, which includes using toliet, bedpan, or urinal?: A Lot Help from another person bathing (including washing, rinsing, drying)?: A Lot Help from another person to put on and taking off regular upper body clothing?: A Little Help from another person to put on and taking off regular lower body clothing?: A Lot 6 Click Score: 15   End of Session Equipment Utilized During Treatment: Gait belt  Activity Tolerance: Patient tolerated treatment  well Patient left:    OT Visit Diagnosis: Unsteadiness on feet (R26.81);Hemiplegia and hemiparesis Hemiplegia - Right/Left: Left Hemiplegia - dominant/non-dominant: Non-Dominant Hemiplegia - caused by:  Cerebral infarction                Time: 0063-4949 OT Time Calculation (min): 28 min Charges:  OT General Charges $OT Visit: 1 Visit OT Evaluation $OT Eval Low Complexity: 1 Low  Laquan Beier OTR/L acute rehab services Office: [phone]   Brittany Macdonald 10/30/2020, 1:35 PM

## 2020-10-31 ENCOUNTER — Inpatient Hospital Stay (HOSPITAL_COMMUNITY): Payer: Medicare Other

## 2020-10-31 DIAGNOSIS — I63412 Cerebral infarction due to embolism of left middle cerebral artery: Secondary | ICD-10-CM

## 2020-10-31 DIAGNOSIS — E119 Type 2 diabetes mellitus without complications: Secondary | ICD-10-CM

## 2020-10-31 LAB — GLUCOSE, CAPILLARY
Glucose-Capillary: 81 mg/dL (ref 70–99)
Glucose-Capillary: 158 mg/dL — ABNORMAL HIGH (ref 70–99)
Glucose-Capillary: 156 mg/dL — ABNORMAL HIGH (ref 70–99)
Glucose-Capillary: 130 mg/dL — ABNORMAL HIGH (ref 70–99)

## 2020-10-31 MED ORDER — GADOBUTROL 1 MMOL/ML IV SOLN
10.0000 mL | Freq: Once | INTRAVENOUS | Status: AC | PRN
  Administered 2020-10-31: 10 mL via INTRAVENOUS

## 2020-10-31 MED ORDER — AMLODIPINE BESYLATE 5 MG PO TABS
5.0000 mg | ORAL_TABLET | Freq: Every day | ORAL | Status: DC
  Administered 2020-10-31 – 2020-11-03 (×4): 5 mg via ORAL
  Filled 2020-10-31 (×4): qty 1

## 2020-10-31 NOTE — Progress Notes (Signed)
Physical Therapy Treatment Patient Details Name: Brittany Macdonald MRN: [id number] DOB: [birth year] Today's Date: 10/31/2020    History of Present Illness pt is 84 y.o. female with PMHx of GERD, RA, DM, HTN, dementia found to have stroke. MRI brain reviewed which showed subacute stroke in left left anterior middle frontal gyrus and anterior operculum associated with linear susceptibility artifact on SWAN likely blood products. MRA showed no large vessel occlusion however diffuse intracranial cerebrovascular atherosclerosis and stenotic disease.    PT Comments    Pt received up in chair upon arrival. Pt pleasant however remains confused and disoriented to place, situation, and date. Pt with improved ability to vocalize however continues with word finding difficulty vs. Confusion vs receptive aphasia. Pt able to follow simple functional commands with exception of moving R LE. Pt will not move R LE despite max verbal and tactile cues. Pt to continue to benefit from CIR upon d/c to maximize functional recovery. Acute PT to cont to follow.    Follow Up Recommendations  CIR     Equipment Recommendations       Recommendations for Other Services Rehab consult     Precautions / Restrictions Precautions Precautions: Fall Precaution Comments: expressive aphasia Restrictions Weight Bearing Restrictions: No    Mobility  Bed Mobility Overal bed mobility: Needs Assistance Bed Mobility: Supine to Sit     Supine to sit: Mod assist     General bed mobility comments: pt up in chair upon PT arrival  Transfers Overall transfer level: Needs assistance Equipment used: Rolling walker (2 wheeled) Transfers: Sit to/from Stand Sit to Stand: Max assist Stand pivot transfers:  (Unable)       General transfer comment: max directoinal verbal and tactile cues to place hands on arm rests and maxA to initiate powering up and steadying while tactile cues used to bring UEs up to walker. Complete x2  and attempted a 3rd trial however pt state "I can't do it" and abruptly sat down.  Ambulation/Gait             General Gait Details: unable to amb at this time. attempted to march in place however pt can only raise L LE but not R LE, not even with assisted weight-shifting to the L   Stairs             Wheelchair Mobility    Modified Rankin (Stroke Patients Only)       Balance Overall balance assessment: Needs assistance Sitting-balance support: Feet supported;Single extremity supported Sitting balance-Leahy Scale: Fair Sitting balance - Comments: Patient able to maintain static sitting balance at EOB without UE support.   Standing balance support: Bilateral upper extremity supported Standing balance-Leahy Scale: Poor Standing balance comment: dependent on external support                            Cognition Arousal/Alertness: Awake/alert Behavior During Therapy: WFL for tasks assessed/performed Overall Cognitive Status: Impaired/Different from baseline Area of Impairment: Orientation;Following commands;Safety/judgement;Awareness;Problem solving                 Orientation Level: Disoriented to;Place;Time;Situation (stated she was from [place] one time)     Following Commands: Follows one step commands with increased time;Follows multi-step commands inconsistently Safety/Judgement: Decreased awareness of safety;Decreased awareness of deficits Awareness: Intellectual (unaware she was urinating) Problem Solving: Slow processing;Decreased initiation;Requires verbal cues;Requires tactile cues General Comments: pt playing with ties on gown upon arrival, completing "busy work". pt remains  not-oriented, unable to state what holiday is in Dec, even when give choices. Pt with fear of falling as noted by behavior but not by vocalization      Exercises General Exercises - Lower Extremity Long Arc Quad: AROM;Both;10 reps;Seated Hip Flexion/Marching:  AROM;Both;10 reps;Seated    General Comments General comments (skin integrity, edema, etc.): VSS      Pertinent Vitals/Pain Pain Assessment: No/denies pain    Home Living       Type of Home: House              Prior Function            PT Goals (current goals can now be found in the care plan section) Acute Rehab PT Goals Patient Stated Goal: to get better Progress towards PT goals: Progressing toward goals    Frequency    Min 4X/week      PT Plan Current plan remains appropriate    Co-evaluation              AM-PAC PT "6 Clicks" Mobility   Outcome Measure  Help needed turning from your back to your side while in a flat bed without using bedrails?: A Little Help needed moving from lying on your back to sitting on the side of a flat bed without using bedrails?: A Lot Help needed moving to and from a bed to a chair (including a wheelchair)?: A Lot Help needed standing up from a chair using your arms (e.g., wheelchair or bedside chair)?: A Lot Help needed to walk in hospital room?: Total Help needed climbing 3-5 steps with a railing? : Total 6 Click Score: 11    End of Session Equipment Utilized During Treatment: Gait belt Activity Tolerance: Patient tolerated treatment well Patient left: in chair;with call bell/phone within reach;with chair alarm set Nurse Communication: Mobility status PT Visit Diagnosis: Unsteadiness on feet (R26.81);Muscle weakness (generalized) (M62.81);Difficulty in walking, not elsewhere classified (R26.2)     Time: 9767-3419 PT Time Calculation (min) (ACUTE ONLY): 18 min  Charges:  $Therapeutic Activity: 8-22 mins                     Brittany Macdonald, PT, DPT Acute Rehabilitation Services Pager #: [phone] Office #: [phone]    Brittany Macdonald 10/31/2020, 2:37 PM

## 2020-10-31 NOTE — Progress Notes (Signed)
STROKE TEAM PROGRESS NOTE   INTERVAL HISTORY No family is at the bedside.  Patient lying in bed, still has partial expressive aphasia but improved perseveration.  MRI showed subacute left MCA infarct. MRA neck unremarkable.   Vitals:   10/31/20 0357 10/31/20 0500 10/31/20 0600 10/31/20 0833  BP: (!) 119/95   (!) 145/79  Pulse: 84   84  Resp: 17   (!) 22  Temp: (!) 97.5 F (36.4 C)   98.2 F (36.8 C)  TempSrc: Oral   Oral  SpO2: 98%   100%  Weight:  78.1 kg 78.1 kg    CBC:  Recent Labs  Lab 10/28/20 1749 10/29/20 1043  WBC 8.1 8.0  NEUTROABS  --  5.2  HGB 12.5 12.3  HCT 37.2 38.3  MCV 90.5 91.6  PLT 199 450   Basic Metabolic Panel:  Recent Labs  Lab 10/28/20 1749  NA 138  K 4.0  CL 101  CO2 25  GLUCOSE 263*  BUN 17  CREATININE 0.87  CALCIUM 9.1   Lipid Panel:  Recent Labs  Lab 10/30/20 0440  CHOL 264*  TRIG 90  HDL 45  CHOLHDL 5.9  VLDL 18  LDLCALC 201*   HgbA1c:  Recent Labs  Lab 10/30/20 0440  HGBA1C 7.4*   Urine Drug Screen:  Recent Labs  Lab 10/29/20 1015  LABOPIA NONE DETECTED  COCAINSCRNUR NONE DETECTED  LABBENZ NONE DETECTED  AMPHETMU NONE DETECTED  THCU NONE DETECTED  LABBARB NONE DETECTED    Alcohol Level  Recent Labs  Lab 10/29/20 1021  ETH <10    IMAGING past 24 hours MR ANGIO NECK W WO CONTRAST  Result Date: 10/31/2020 CLINICAL DATA:  Carotid artery stenosis. Partial expressive aphasia. Subacute left MCA infarct on MRI. EXAM: MRA NECK WITHOUT AND WITH CONTRAST TECHNIQUE: Multiplanar and multiecho pulse sequences of the neck were obtained without and with intravenous contrast. Angiographic images of the neck were obtained using MRA technique without and with intravenous contrast. CONTRAST:  57mL GADAVIST GADOBUTROL 1 MMOL/ML IV SOLN COMPARISON:  None. FINDINGS: There is a standard 3 vessel aortic arch. The brachiocephalic and subclavian arteries are widely patent. The common carotid and cervical internal carotid arteries are  patent bilaterally without evidence of a significant stenosis or dissection. Irregularity of the distal right common carotid artery at the bifurcation likely reflects atherosclerosis and results in less than 50% luminal narrowing. The proximal right common carotid artery, and distal right cervical ICA, and proximal left ICA are tortuous. The vertebral arteries are patent and codominant with antegrade flow bilaterally. Focal signal loss at the vertebral artery origins suggests potential severe stenoses bilaterally, right greater than left. There is asymmetric diffuse tortuosity of the right V2 segment. Widespread intracranial atherosclerosis and associated stenoses were more fully evaluated on the recent head MRA. IMPRESSION: 1. Patent cervical carotid arteries without significant stenosis. 2. Patent vertebral arteries with potential severe stenoses at both vertebral artery origins. Electronically Signed   By: Logan Bores M.D.   On: 10/31/2020 06:20   ECHOCARDIOGRAM COMPLETE  Result Date: 10/30/2020    ECHOCARDIOGRAM REPORT   Patient Name:   Brittany Macdonald Date of Exam: 10/30/2020 Medical Rec #:  [id number]          Height:       66.0 in Accession #:    [id number]         Weight:       257.0 lb Date of Birth:  [birth year]  BSA:          2.225 m Patient Age:    8 years           BP:           143/90 mmHg Patient Gender: F                  HR:           93 bpm. Exam Location:  Inpatient Procedure: 2D Echo, Color Doppler and Cardiac Doppler Indications:    Y30.16 Acute systolic (congestive) heart failure  History:        Patient has no prior history of Echocardiogram examinations.                 Risk Factors:Hypertension and Diabetes.  Sonographer:    Raquel Sarna Senior RDCS Referring Phys: [phone] [place]  1. Left ventricular ejection fraction, by estimation, is 55 to 60%. The left ventricle has normal function. The left ventricle has no regional wall motion abnormalities. There is  moderate left ventricular hypertrophy. Left ventricular diastolic parameters are consistent with Grade I diastolic dysfunction (impaired relaxation).  2. Right ventricular systolic function is normal. The right ventricular size is normal. Tricuspid regurgitation signal is inadequate for assessing PA pressure.  3. The mitral valve is normal in structure. No evidence of mitral valve regurgitation. Moderate mitral annular calcification.  4. The aortic valve is tricuspid. Aortic valve regurgitation is not visualized. Mild aortic valve sclerosis is present, with no evidence of aortic valve stenosis.  5. The inferior vena cava is normal in size with greater than 50% respiratory variability, suggesting right atrial pressure of 3 mmHg. FINDINGS  Left Ventricle: Left ventricular ejection fraction, by estimation, is 55 to 60%. The left ventricle has normal function. The left ventricle has no regional wall motion abnormalities. The left ventricular internal cavity size was normal in size. There is  moderate left ventricular hypertrophy. Left ventricular diastolic parameters are consistent with Grade I diastolic dysfunction (impaired relaxation). Right Ventricle: The right ventricular size is normal. No increase in right ventricular wall thickness. Right ventricular systolic function is normal. Tricuspid regurgitation signal is inadequate for assessing PA pressure. Left Atrium: Left atrial size was normal in size. Right Atrium: Right atrial size was normal in size. Pericardium: There is no evidence of pericardial effusion. Mitral Valve: The mitral valve is normal in structure. There is mild calcification of the mitral valve leaflet(s). Moderate mitral annular calcification. No evidence of mitral valve regurgitation. Tricuspid Valve: The tricuspid valve is normal in structure. Tricuspid valve regurgitation is not demonstrated. Aortic Valve: The aortic valve is tricuspid. Aortic valve regurgitation is not visualized. Mild aortic  valve sclerosis is present, with no evidence of aortic valve stenosis. Pulmonic Valve: The pulmonic valve was normal in structure. Pulmonic valve regurgitation is not visualized. Aorta: The aortic root is normal in size and structure. Venous: The inferior vena cava is normal in size with greater than 50% respiratory variability, suggesting right atrial pressure of 3 mmHg. IAS/Shunts: No atrial level shunt detected by color flow Doppler.  LEFT VENTRICLE PLAX 2D LVIDd:         3.70 cm  Diastology LVIDs:         3.10 cm  LV e' medial:    4.68 cm/s LV PW:         1.40 cm  LV E/e' medial:  17.8 LV IVS:        1.50 cm  LV e' lateral:  5.11 cm/s LVOT diam:     1.90 cm  LV E/e' lateral: 16.3 LV SV:         47 LV SV Index:   21 LVOT Area:     2.84 cm  RIGHT VENTRICLE RV S prime:     15.10 cm/s TAPSE (M-mode): 2.2 cm LEFT ATRIUM             Index       RIGHT ATRIUM           Index LA diam:        3.00 cm 1.35 cm/m  RA Area:     11.50 cm LA Vol (A2C):   42.4 ml 19.06 ml/m RA Volume:   22.20 ml  9.98 ml/m LA Vol (A4C):   50.7 ml 22.79 ml/m LA Biplane Vol: 47.5 ml 21.35 ml/m  AORTIC VALVE LVOT Vmax:   89.70 cm/s LVOT Vmean:  61.800 cm/s LVOT VTI:    0.166 m  AORTA Ao Root diam: 3.30 cm Ao Asc diam:  3.00 cm MITRAL VALVE MV Area (PHT): 2.62 cm    SHUNTS MV Decel Time: 289 msec    Systemic VTI:  0.17 m MV E velocity: 83.20 cm/s  Systemic Diam: 1.90 cm MV A velocity: 95.40 cm/s MV E/A ratio:  0.87 Loralie Champagne MD Electronically signed by Loralie Champagne MD Signature Date/Time: 10/30/2020/5:06:45 PM    Final    VAS US CAROTID  Result Date: 10/30/2020 Carotid Arterial Duplex Study Indications:       CVA. Risk Factors:      Hypertension, Diabetes. Limitations        Today's exam was limited due to shadowing and patient                    anatomy. Comparison Study:  No prior studies. Performing Technologist: Darlin Coco RDMS Supporting Technologist: Oda Cogan RDMS, RVT  Examination Guidelines: A complete evaluation  includes B-mode imaging, spectral Doppler, color Doppler, and power Doppler as needed of all accessible portions of each vessel. Bilateral testing is considered an integral part of a complete examination. Limited examinations for reoccurring indications may be performed as noted.  Right Carotid Findings: +----------+--------+--------+--------+------------------+--------+           PSV cm/sEDV cm/sStenosisPlaque DescriptionComments +----------+--------+--------+--------+------------------+--------+ CCA Prox  75      13                                         +----------+--------+--------+--------+------------------+--------+ CCA Distal49      8                                          +----------+--------+--------+--------+------------------+--------+ ICA Prox  48      11      1-39%   calcific                   +----------+--------+--------+--------+------------------+--------+ ICA Distal69      17                                         +----------+--------+--------+--------+------------------+--------+ ECA       77                                                 +----------+--------+--------+--------+------------------+--------+ +----------+--------+-------+----------------+-------------------+  PSV cm/sEDV cmsDescribe        Arm Pressure (mmHG) +----------+--------+-------+----------------+-------------------+ Subclavian228            Multiphasic, WNL                    +----------+--------+-------+----------------+-------------------+ +---------+--------+--+--------+--+---------+ VertebralPSV cm/s54EDV cm/s13Antegrade +---------+--------+--+--------+--+---------+  Left Carotid Findings: +----------+--------+--------+--------+------------------+---------------------+           PSV cm/sEDV cm/sStenosisPlaque DescriptionComments              +----------+--------+--------+--------+------------------+---------------------+ CCA Prox  90                                                               +----------+--------+--------+--------+------------------+---------------------+ CCA Distal86      10                                                      +----------+--------+--------+--------+------------------+---------------------+ ICA Prox                                            Difficult to                                                              visualize due to                                                          patient anatomy and                                                       shadowing.            +----------+--------+--------+--------+------------------+---------------------+ ICA Distal69      11                                                      +----------+--------+--------+--------+------------------+---------------------+ ECA       56                                                              +----------+--------+--------+--------+------------------+---------------------+ +----------+--------+--------+----------------+-------------------+           PSV cm/sEDV cm/sDescribe  Arm Pressure (mmHG) +----------+--------+--------+----------------+-------------------+ Subclavian132             Multiphasic, WNL                    +----------+--------+--------+----------------+-------------------+ +---------+--------+--+--------+--+---------+ VertebralPSV cm/s63EDV cm/s15Antegrade +---------+--------+--+--------+--+---------+   Summary: Right Carotid: Velocities in the right ICA are consistent with a 1-39% stenosis. Left Carotid: Difficult to visualize left ICA due to patient anatomy and               shadowing. Vertebrals:  Bilateral vertebral arteries demonstrate antegrade flow. Subclavians: Normal flow hemodynamics were seen in bilateral subclavian              arteries. *See table(s) above for measurements and observations.  Electronically signed by  Ruta Hinds MD on 10/30/2020 at 7:39:14 PM.    Final     PHYSICAL EXAM  Temp:  [96.6 F (35.9 C)-98.4 F (36.9 C)] 98.2 F (36.8 C) (12/14 0833) Pulse Rate:  [84-93] 84 (12/14 0833) Resp:  [15-22] 22 (12/14 0833) BP: (119-178)/(73-95) 145/79 (12/14 0833) SpO2:  [98 %-100 %] 100 % (12/14 0833) Weight:  [78.1 kg] 78.1 kg (12/14 0600)  General - Well nourished, well developed, in no apparent distress.  Ophthalmologic - fundi not visualized due to noncooperation.  Cardiovascular - Regular rhythm and rate.  Neuro - Awake alert, have spontaneous speech with short sentences but still has intermittent paraphasic errors, perseveration on naming test and repetition. Able to follow all simple commands. No gaze palsy or visual field deficit. No facial droop, tongue midline. LUE at least 4/5 without drift. RUE pain on proximal movement, not able to against gravity. Right hand grip 4-/5. RLE proximal 3-/5 and knee flexion 3/5, toe DF 4/5 PF 5/5. LLE pain on proximal movement, 2-/5, knee flexion 2/5, but toe DF 5/5 PF 5/5. Left FTN intact. Sensation symmetrical. Gait not tested.   ASSESSMENT/PLAN Brittany Macdonald is a 84 y.o. female with history of GERD, RA, DM, HTN, dementia presenting with diffuse weakness w/o focal deficits. At baseline her speech is perseverative and she is unable to formulate sentences or ideas. MRI positive for stroke.   Stroke:   L MCA infarct w/ petechial hemorrhage, etiology unclear, cardioembolic versus large vessel source  CT head No acute abnormality. Small vessel disease. Atrophy.   MRI  Subacute L anterior middle frontal gyrus and anterior opercular infarct w/ petechial hemorrhage. Scattered microhemorrhages, less typical than CAA. Small vessel disease.   MRA  No LVO. Widespread intracranial atherosclerosis:  Moderate L M3 branches, B A2s. Moderate to severe B PCAs.  Carotid Doppler  Not able to visualized left ICA  MRA neck unremarkable  2D Echo EF  55-60%  Recommend 30 day cardiac event monitoring as outpt to rule out afib  LDL 201  HgbA1c 7.4  VTE prophylaxis - Lovenox 40 mg sq daily   No antithrombotic prior to admission, now on aspirin 81 mg daily and plavix 75mg  DAPT for 3 weeks and then ASA alone.   Therapy recommendations:  CIR  Disposition:  pending   Hypertension  Stable . Long-term BP goal normotensive  Hyperlipidemia  Home meds:  No statin  Now on lipitor 40  LDL 201, goal < 70  Continue statin at discharge  Diabetes type II Uncontrolled  HgbA1c 7.4, goal < 7.0  CBGs   SSI  Close PCP follow-up for better DM control  Other Stroke Risk Factors  Advanced Age >/= 68   Morbid Obesity, BMI >/= 30  associated with increased stroke risk, recommend weight loss, diet and exercise as appropriate   Other Active Problems  Severe baseline dementia  Fibromyalgia   GERD  Hospital day # 2  Neurology will sign off. Please call with questions. Pt will follow up with stroke clinic NP at [facility] in about 4 weeks. Thanks for the consult.  Rosalin Hawking, MD PhD Stroke Neurology 10/31/2020 7:12 PM   To contact Stroke Continuity provider, please refer to [email or web address] After hours, contact General Neurology

## 2020-10-31 NOTE — Consult Note (Signed)
Physical Medicine and Rehabilitation Consult Reason for Consult: Weakness with aphasia Referring Physician: Internal medicine   HPI: Brittany Macdonald is a 84 y.o. right-handed female with history of RA, diabetes mellitus, hypertension, dementia.  Presented 10/28/2020 with reported diffuse weakness and aphasia.  Patient was a poor historian.  Cranial CT scan showed mild diffuse cortical atrophy no acute intracranial abnormality.  Patient did not receive TPA.  MRI shows abnormal left anterior middle frontal gyrus and anterior operculum most suggestive of subacute infarct with petechial hemorrhage.  No active bleeding or mass-effect.  MRA of the head showed no large vessel occlusion.  MRI of the neck pending.  Echocardiogram with ejection fraction of 55 to 60% no wall motion abnormalities grade 1 diastolic dysfunction.  Admission chemistries unremarkable except glucose 263, urine drug screen negative, hemoglobin A1c 7.4.  Currently on aspirin 81 mg daily and Plavix 75 mg daily for CVA prophylaxis x3 weeks and aspirin alone.  Tolerating a regular consistency diet.  Therapy evaluations completed with recommendations of physical medicine rehab consult.   Pt kept playing with cardiac monitoring- would NOT stop, no matter what we/she did.   Review of Systems  Unable to perform ROS: Dementia  Constitutional: Negative for chills and fever.  HENT: Negative for hearing loss.   Eyes: Negative for blurred vision and double vision.  Respiratory: Negative for cough and shortness of breath.   Cardiovascular: Negative for chest pain, palpitations and leg swelling.  Gastrointestinal: Positive for constipation. Negative for heartburn.       GERD  Genitourinary: Negative for dysuria, flank pain and hematuria.  Musculoskeletal: Positive for joint pain.       Chronic neck pain  Skin: Negative for rash.  Neurological: Positive for speech change and weakness.  All other systems reviewed and are  negative.  Past Medical History:  Diagnosis Date  . Chronic neck pain   . Diabetes mellitus without complication ([place])   . Fibromyalgia   . GERD (gastroesophageal reflux disease)   . GI bleed 07/12/2014  . Hypertension   . Obesity   . Osteoarthritis   . Refusal of blood product   . Rheumatoid arthritis(714.0)    Past Surgical History:  Procedure Laterality Date  . COLONOSCOPY N/A 05/28/2014   Procedure: COLONOSCOPY;  Surgeon: Juanita Craver, MD;  Location: [facility] ENDOSCOPY;  Service: Endoscopy;  Laterality: N/A;  . ESOPHAGOGASTRODUODENOSCOPY N/A 05/27/2014   Procedure: ESOPHAGOGASTRODUODENOSCOPY (EGD);  Surgeon: Milus Banister, MD;  Location: [place];  Service: Endoscopy;  Laterality: N/A;  . JOINT REPLACEMENT     bilat hips  . TOTAL HIP ARTHROPLASTY Bilateral   . TUBAL LIGATION     No family history on file. Social History:  reports that she has never smoked. She has never used smokeless tobacco. She reports that she does not drink alcohol and does not use drugs. Allergies:  PCN  Medications Prior to Admission  Medication Sig Dispense Refill  . insulin aspart (NOVOLOG) 100 UNIT/ML injection Inject 5 Units into the skin 3 (three) times daily before meals. (Patient not taking: Reported on 10/29/2020)    . insulin glargine (LANTUS) 100 UNIT/ML injection Inject 0.2 mLs (20 Units total) into the skin at bedtime. (Patient not taking: Reported on 10/29/2020)      Home: Home Living Family/patient expects to be discharged to:: Private residence Living Arrangements: Other (Comment) (unclear, initially saying daughter drops in to visit, then daughter live with her. unable to reach daughter this date.) Type of Home:  House Additional Comments: per chart pt came from home however pt poor historian and unable to reach daughter for PLOF or house set up  Functional History: Prior Function Level of Independence: Independent Comments: No family/caregivers present to confirm social or PLOF,  unable to reach daughter Functional Status:  Mobility: Bed Mobility Overal bed mobility: Needs Assistance Bed Mobility: Supine to Sit Supine to sit: Mod assist General bed mobility comments: HOB elevated, instructed to use rail for support to initate, requiring A for transitioning trunk to sitting upright. Transfers Overall transfer level: Needs assistance Equipment used: 2 person hand held assist Transfers: Stand Pivot Transfers,Sit to/from Stand Sit to Stand: Max assist Stand pivot transfers: Mod assist,+2 physical assistance General transfer comment: maxA to power up into standing and achieve full upright posture, pt unable to advance R LE during transfer which increased pts L lateral lean and trunk flexion requiring maxA to advance LEs during std pvt to chair      ADL: ADL Overall ADL's : Needs assistance/impaired Eating/Feeding: Set up,Sitting Grooming: Wash/dry face,Supervision/safety,Sitting Upper Body Dressing : Minimal assistance,Sitting Lower Body Dressing: Sit to/from stand,Maximal assistance,+2 for physical assistance Lower Body Dressing Details (indicate cue type and reason): demos ability to participate with seated LB ADL's with forward flexion to feet, but with standing, need for +2 for safety d/t inability to safely remove UE support from surface in standing for clothing management. simualted. Toilet Transfer: Moderate assistance,+2 for physical assistance Toileting- Clothing Manipulation and Hygiene: Maximal assistance,Cueing for sequencing,Cueing for compensatory techniques General ADL Comments: will require confirmation of baseline with family once present or reachable. limited significant by aphsia  Cognition: Cognition Overall Cognitive Status: Impaired/Different from baseline Orientation Level: Oriented to person Cognition Arousal/Alertness: Awake/alert Behavior During Therapy: WFL for tasks assessed/performed Overall Cognitive Status: Impaired/Different  from baseline Area of Impairment: Orientation,Following commands,Safety/judgement,Awareness,Problem solving Orientation Level: Place,Situation,Time (anticipate limitations also d/t aphasia) Following Commands: Follows one step commands consistently,Follows multi-step commands inconsistently Safety/Judgement: Decreased awareness of safety,Decreased awareness of deficits Problem Solving: Slow processing,Requires verbal cues,Requires tactile cues General Comments: pt unable to state month or year stand alone or when given choice, suspect expressive aphasia contributing to this difficulty. Pt poor historian and kept referring to daughter when any question was asked whether it was relevant or not  Blood pressure (!) 119/95, pulse 84, temperature (!) 97.5 F (36.4 C), temperature source Oral, resp. rate 17, SpO2 98 %. Physical Exam Vitals and nursing note reviewed.  Constitutional:      Comments: Pt awake, but VERY confused- kept perseverating on cardiac monitoring and kept saying "it's OK, it's OK", sitting up in bedside chair, has purewick, NAD  HENT:     Head: Normocephalic and atraumatic.     Comments: Smile appears somewhat equal- couldn't participate in tongue     Right Ear: External ear normal.     Left Ear: External ear normal.     Nose: Nose normal. No congestion.     Mouth/Throat:     Mouth: Mucous membranes are dry.     Pharynx: Oropharynx is clear.  Eyes:     General:        Right eye: No discharge.        Left eye: No discharge.     Extraocular Movements: Extraocular movements intact.  Cardiovascular:     Comments: RRR- no JVD Pulmonary:     Comments: CTA B/L- no W/R/R- good air movement  Abdominal:     Comments: Soft, NT, ND, (+)BS    Genitourinary:  Comments: purewick- almost full container- dark amber Musculoskeletal:     Cervical back: Normal range of motion. No rigidity.     Comments: At least 3/5 UEs and LEs- but hard to get her to participate  Skin:     General: Skin is warm and dry.  Neurological:     Comments: Patient is alert in no acute distress.  Makes eye contact with examiner.  She is aphasic but able to speak some short sentences.  Follows simple commands.  Kept saying "it's oK" and perseverating on cardiac monitor- took it apart!  Psychiatric:     Comments: Flat affect- monotone     Results for orders placed or performed during the hospital encounter of 10/28/20 (from the past 24 hour(s))  Glucose, capillary     Status: Abnormal   Collection Time: 10/30/20 12:23 PM  Result Value Ref Range   Glucose-Capillary 127 (H) 70 - 99 mg/dL  Glucose, capillary     Status: Abnormal   Collection Time: 10/30/20  4:29 PM  Result Value Ref Range   Glucose-Capillary 182 (H) 70 - 99 mg/dL   Comment 1 Notify RN    Comment 2 Document in Chart   Glucose, capillary     Status: Abnormal   Collection Time: 10/30/20  9:24 PM  Result Value Ref Range   Glucose-Capillary 163 (H) 70 - 99 mg/dL   Comment 1 Notify RN    Comment 2 Document in Chart    DG Chest 2 View  Result Date: 10/29/2020 CLINICAL DATA:  Screening for metallic implants prior to MRI EXAM: CHEST - 2 VIEW COMPARISON:  06/26/2015 FINDINGS: Heart size is upper limits of normal. Atherosclerotic calcification of the aortic knob. Chronic elevation of the right hemidiaphragm. No focal airspace consolidation, pleural effusion, or pneumothorax. Degenerative changes of the bilateral shoulders. No radiopaque foreign body identified within the chest. IMPRESSION: No active cardiopulmonary disease. No metallic foreign body identified within the chest. Electronically Signed   By: Davina Poke D.O.   On: 10/29/2020 14:57   DG Abd 1 View  Result Date: 10/29/2020 CLINICAL DATA:  Screening for MRI. EXAM: ABDOMEN - 1 VIEW COMPARISON:  Noncontrast abdominopelvic CT 05/15/2016 FINDINGS: Bilateral total hip arthroplasties. No other implanted medical device or radiopaque foreign body to preclude MRI  imaging. Normal bowel gas pattern with moderate volume of stool. Vascular calcifications. Popcorn calcification in the pelvis consistent with phleboliths. IMPRESSION: Bilateral total hip arthroplasties. No other implanted medical device or radiopaque foreign body to preclude MRI imaging. Electronically Signed   By: Keith Rake M.D.   On: 10/29/2020 14:58   CT HEAD WO CONTRAST  Result Date: 10/29/2020 CLINICAL DATA:  Increased weakness. EXAM: CT HEAD WITHOUT CONTRAST TECHNIQUE: Contiguous axial images were obtained from the base of the skull through the vertex without intravenous contrast. COMPARISON:  None. FINDINGS: Brain: Mild diffuse cortical atrophy is noted. Mild chronic ischemic white matter disease is noted. No mass effect or midline shift is noted. Ventricular size is within normal limits. There is no evidence of mass lesion, hemorrhage or acute infarction. Vascular: No hyperdense vessel or unexpected calcification. Skull: Normal. Negative for fracture or focal lesion. Sinuses/Orbits: No acute finding. Other: None. IMPRESSION: Mild diffuse cortical atrophy. Mild chronic ischemic white matter disease. No acute intracranial abnormality seen. Electronically Signed   By: Marijo Conception M.D.   On: 10/29/2020 08:34   MR ANGIO HEAD WO CONTRAST  Result Date: 10/29/2020 CLINICAL DATA:  84 year old female with increased weakness, stroke-like symptoms.  MRI today suggesting subacute left anterior division MCA infarct. EXAM: MRA HEAD WITHOUT CONTRAST TECHNIQUE: Angiographic images of the Circle of Willis were obtained using MRA technique without intravenous contrast. COMPARISON:  Brain MRI 1457 hours today. FINDINGS: Antegrade flow in the posterior circulation. Fairly codominant distal vertebral arteries without stenosis. Both PICA origins are patent. Patent vertebrobasilar junction and basilar artery with mild irregularity and stenosis. Patent SCA and PCA origins. Moderate left P1, P2, and right P2 PCA  stenoses. Furthermore there is duplication of the left PCA by the Pcomm (each supplying either superior or inferior PCA divisions). And there are also moderate to severe stenoses of the distal left Pcomm fairly symmetric distal PCA branch flow signal. Normal left posterior communicating artery origin, the right is diminutive or absent. Antegrade flow in both ICA siphons. Tortuous right siphon just below the skull base. Both ICAs are patent to the terminus with mild to moderate irregularity greater on the left, but only mild left siphon stenosis. Patent MCA and ACA origins. The right A1 is dominant and the left diminutive. Anterior communicating artery is normal. Median artery of the corpus callosum is present (normal variant). There are moderate bilateral ACA A2 stenoses. Preserved distal ACA flow signal. Right MCA M1 and bifurcation are patent without stenosis. Mild right MCA branch irregularity. Left MCA M1 and left MCA bifurcation are patent without stenosis. Moderate irregularity of visible left MCA M3 branches. No discrete branch occlusion identified. IMPRESSION: No large vessel occlusion or Left MCA branch occlusion identified, but widespread intracranial atherosclerosis and stenosis: Moderate stenosis of visible Left MCA M3 branches. Moderate stenosis of bilateral ACA A2 segments. Moderate to severe stenosis of bilateral PCAs. Electronically Signed   By: Genevie Ann M.D.   On: 10/29/2020 18:05   MR BRAIN WO CONTRAST  Result Date: 10/29/2020 CLINICAL DATA:  84 year old female with increased weakness, stroke-like symptoms. EXAM: MRI HEAD WITHOUT CONTRAST TECHNIQUE: Multiplanar, multiecho pulse sequences of the brain and surrounding structures were obtained without intravenous contrast. COMPARISON:  Head CT 0810 hours today. FINDINGS: Brain: No convincing restricted diffusion to suggest acute infarction. Mild susceptibility artifact on DWI in the anterior left middle frontal gyrus related to confluent linear  blood products (series 7, image 66) with mild surrounding cerebral edema and/or developing myelomalacia (series 6, image 17). Similar gyral signal abnormality tracking toward the anterior left operculum, also with microhemorrhage (series 7, image 53). Superimposed confluent bilateral cerebral white matter T2 and FLAIR hyperintensity. No other cortical signal changes identified. A few scattered chronic microhemorrhages elsewhere in both hemispheres (such as bilaterally series 7, image 57). No midline shift, mass effect, evidence of mass lesion, ventriculomegaly, extra-axial collection. Cervicomedullary junction and pituitary are within normal limits. Dilated perivascular spaces in the basal ganglia. Otherwise the deep gray nuclei, brainstem and cerebellum remain normal for age. Vascular: Major intracranial vascular flow voids are preserved. Generalized arterial tortuosity. Skull and upper cervical spine: Visible cervical spine degeneration is concordant with age, including degenerative ligamentous hypertrophy about the odontoid. Visualized bone marrow signal is within normal limits. Sinuses/Orbits: Negative orbits. Paranasal Visualized paranasal sinuses and mastoids are stable and well pneumatized. Other: Grossly normal visible internal auditory structures. IMPRESSION: 1. Abnormal left anterior middle frontal gyrus and anterior operculum most suggestive of a subacute infarct with petechial hemorrhage. No active bleeding or mass effect. 2. No other acute intracranial abnormality. Scattered chronic microhemorrhages elsewhere in the brain, although less than typical of amyloid angiopathy. Advanced bilateral white matter disease. Electronically Signed   By: Lemmie Evens  [place] Crane M.D.   On: 10/29/2020 15:34   ECHOCARDIOGRAM COMPLETE  Result Date: 10/30/2020    ECHOCARDIOGRAM REPORT   Patient Name:   Brittany Macdonald Date of Exam: 10/30/2020 Medical Rec #:  [id number]          Height:       66.0 in Accession #:    [id number]          Weight:       257.0 lb Date of Birth:  1936/11/18          BSA:          2.225 m Patient Age:    31 years           BP:           143/90 mmHg Patient Gender: F                  HR:           93 bpm. Exam Location:  Inpatient Procedure: 2D Echo, Color Doppler and Cardiac Doppler Indications:    R74.08 Acute systolic (congestive) heart failure  History:        Patient has no prior history of Echocardiogram examinations.                 Risk Factors:Hypertension and Diabetes.  Sonographer:    Raquel Sarna Senior RDCS Referring Phys: [phone] [place]  1. Left ventricular ejection fraction, by estimation, is 55 to 60%. The left ventricle has normal function. The left ventricle has no regional wall motion abnormalities. There is moderate left ventricular hypertrophy. Left ventricular diastolic parameters are consistent with Grade I diastolic dysfunction (impaired relaxation).  2. Right ventricular systolic function is normal. The right ventricular size is normal. Tricuspid regurgitation signal is inadequate for assessing PA pressure.  3. The mitral valve is normal in structure. No evidence of mitral valve regurgitation. Moderate mitral annular calcification.  4. The aortic valve is tricuspid. Aortic valve regurgitation is not visualized. Mild aortic valve sclerosis is present, with no evidence of aortic valve stenosis.  5. The inferior vena cava is normal in size with greater than 50% respiratory variability, suggesting right atrial pressure of 3 mmHg. FINDINGS  Left Ventricle: Left ventricular ejection fraction, by estimation, is 55 to 60%. The left ventricle has normal function. The left ventricle has no regional wall motion abnormalities. The left ventricular internal cavity size was normal in size. There is  moderate left ventricular hypertrophy. Left ventricular diastolic parameters are consistent with Grade I diastolic dysfunction (impaired relaxation). Right Ventricle: The right ventricular size is  normal. No increase in right ventricular wall thickness. Right ventricular systolic function is normal. Tricuspid regurgitation signal is inadequate for assessing PA pressure. Left Atrium: Left atrial size was normal in size. Right Atrium: Right atrial size was normal in size. Pericardium: There is no evidence of pericardial effusion. Mitral Valve: The mitral valve is normal in structure. There is mild calcification of the mitral valve leaflet(s). Moderate mitral annular calcification. No evidence of mitral valve regurgitation. Tricuspid Valve: The tricuspid valve is normal in structure. Tricuspid valve regurgitation is not demonstrated. Aortic Valve: The aortic valve is tricuspid. Aortic valve regurgitation is not visualized. Mild aortic valve sclerosis is present, with no evidence of aortic valve stenosis. Pulmonic Valve: The pulmonic valve was normal in structure. Pulmonic valve regurgitation is not visualized. Aorta: The aortic root is normal in size and structure. Venous: The inferior vena cava is normal in size with  greater than 50% respiratory variability, suggesting right atrial pressure of 3 mmHg. IAS/Shunts: No atrial level shunt detected by color flow Doppler.  LEFT VENTRICLE PLAX 2D LVIDd:         3.70 cm  Diastology LVIDs:         3.10 cm  LV e' medial:    4.68 cm/s LV PW:         1.40 cm  LV E/e' medial:  17.8 LV IVS:        1.50 cm  LV e' lateral:   5.11 cm/s LVOT diam:     1.90 cm  LV E/e' lateral: 16.3 LV SV:         47 LV SV Index:   21 LVOT Area:     2.84 cm  RIGHT VENTRICLE RV S prime:     15.10 cm/s TAPSE (M-mode): 2.2 cm LEFT ATRIUM             Index       RIGHT ATRIUM           Index LA diam:        3.00 cm 1.35 cm/m  RA Area:     11.50 cm LA Vol (A2C):   42.4 ml 19.06 ml/m RA Volume:   22.20 ml  9.98 ml/m LA Vol (A4C):   50.7 ml 22.79 ml/m LA Biplane Vol: 47.5 ml 21.35 ml/m  AORTIC VALVE LVOT Vmax:   89.70 cm/s LVOT Vmean:  61.800 cm/s LVOT VTI:    0.166 m  AORTA Ao Root diam: 3.30 cm  Ao Asc diam:  3.00 cm MITRAL VALVE MV Area (PHT): 2.62 cm    SHUNTS MV Decel Time: 289 msec    Systemic VTI:  0.17 m MV E velocity: 83.20 cm/s  Systemic Diam: 1.90 cm MV A velocity: 95.40 cm/s MV E/A ratio:  0.87 Loralie Champagne MD Electronically signed by Loralie Champagne MD Signature Date/Time: 10/30/2020/5:06:45 PM    Final    VAS US CAROTID  Result Date: 10/30/2020 Carotid Arterial Duplex Study Indications:       CVA. Risk Factors:      Hypertension, Diabetes. Limitations        Today's exam was limited due to shadowing and patient                    anatomy. Comparison Study:  No prior studies. Performing Technologist: Darlin Coco RDMS Supporting Technologist: Oda Cogan RDMS, RVT  Examination Guidelines: A complete evaluation includes B-mode imaging, spectral Doppler, color Doppler, and power Doppler as needed of all accessible portions of each vessel. Bilateral testing is considered an integral part of a complete examination. Limited examinations for reoccurring indications may be performed as noted.  Right Carotid Findings: +----------+--------+--------+--------+------------------+--------+           PSV cm/sEDV cm/sStenosisPlaque DescriptionComments +----------+--------+--------+--------+------------------+--------+ CCA Prox  75      13                                         +----------+--------+--------+--------+------------------+--------+ CCA Distal49      8                                          +----------+--------+--------+--------+------------------+--------+ ICA Prox  48      11  1-39%   calcific                   +----------+--------+--------+--------+------------------+--------+ ICA Distal69      17                                         +----------+--------+--------+--------+------------------+--------+ ECA       77                                                 +----------+--------+--------+--------+------------------+--------+  +----------+--------+-------+----------------+-------------------+           PSV cm/sEDV cmsDescribe        Arm Pressure (mmHG) +----------+--------+-------+----------------+-------------------+ Subclavian228            Multiphasic, WNL                    +----------+--------+-------+----------------+-------------------+ +---------+--------+--+--------+--+---------+ VertebralPSV cm/s54EDV cm/s13Antegrade +---------+--------+--+--------+--+---------+  Left Carotid Findings: +----------+--------+--------+--------+------------------+---------------------+           PSV cm/sEDV cm/sStenosisPlaque DescriptionComments              +----------+--------+--------+--------+------------------+---------------------+ CCA Prox  90                                                              +----------+--------+--------+--------+------------------+---------------------+ CCA Distal86      10                                                      +----------+--------+--------+--------+------------------+---------------------+ ICA Prox                                            Difficult to                                                              visualize due to                                                          patient anatomy and                                                       shadowing.            +----------+--------+--------+--------+------------------+---------------------+ ICA Distal69  11                                                      +----------+--------+--------+--------+------------------+---------------------+ ECA       56                                                              +----------+--------+--------+--------+------------------+---------------------+ +----------+--------+--------+----------------+-------------------+           PSV cm/sEDV cm/sDescribe        Arm Pressure (mmHG)  +----------+--------+--------+----------------+-------------------+ Subclavian132             Multiphasic, WNL                    +----------+--------+--------+----------------+-------------------+ +---------+--------+--+--------+--+---------+ VertebralPSV cm/s63EDV cm/s15Antegrade +---------+--------+--+--------+--+---------+   Summary: Right Carotid: Velocities in the right ICA are consistent with a 1-39% stenosis. Left Carotid: Difficult to visualize left ICA due to patient anatomy and               shadowing. Vertebrals:  Bilateral vertebral arteries demonstrate antegrade flow. Subclavians: Normal flow hemodynamics were seen in bilateral subclavian              arteries. *See table(s) above for measurements and observations.  Electronically signed by Ruta Hinds MD on 10/30/2020 at 7:39:14 PM.    Final      Assessment/Plan: Diagnosis: L MCA infarct with aphasia- and dementia 1. Does the need for close, 24 hr/day medical supervision in concert with the patient's rehab needs make it unreasonable for this patient to be served in a less intensive setting? Potentially 2. Co-Morbidities requiring supervision/potential complications: HTN, dementia, Stroke with aphasia-  3. Due to bladder management, bowel management, safety, skin/wound care, disease management, medication administration, pain management and patient education, does the patient require 24 hr/day rehab nursing? Potentially 4. Does the patient require coordinated care of a physician, rehab nurse, therapy disciplines of PT, OT and SLP to address physical and functional deficits in the context of the above medical diagnosis(es)? Potentially Addressing deficits in the following areas: balance, endurance, locomotion, strength, transferring, bowel/bladder control, bathing, dressing, feeding, grooming, toileting, cognition, language and swallowing 5. Can the patient actively participate in an intensive therapy program of at least 3 hrs  of therapy per day at least 5 days per week? Potentially 6. The potential for patient to make measurable gains while on inpatient rehab is fair 7. Anticipated functional outcomes upon discharge from inpatient rehab are min assist  with PT, min assist with OT, mod assist and max assist with SLP. 8. Estimated rehab length of stay to reach the above functional goals is: not sure if meets criteria currently 9. Anticipated discharge destination: Home 10. Overall Rehab/Functional Prognosis: fair  RECOMMENDATIONS: This patient's condition is appropriate for continued rehabilitative care in the following setting: N/A Patient has agreed to participate in recommended program. Potentially Note that insurance prior authorization may be required for reimbursement for recommended care.  Comment:  1. Cannot determine if pt appropriate for CIR due to dementia.  2. Will continue to follow to see if can learn for going home.  3. Suggest Amantadine for aphasia 100  mg daily.    Lavon Paganini Angiulli, PA-C 10/31/2020     I have personally performed a face to face diagnostic evaluation of this patient and formulated the key components of the plan.  Additionally, I have personally reviewed laboratory data, imaging studies, as well as relevant notes and concur with the physician assistant's documentation above.

## 2020-10-31 NOTE — Progress Notes (Signed)
Occupational Therapy Treatment Patient Details Name: Brittany Macdonald MRN: [id number] DOB: 1936/04/08 Today's Date: 10/31/2020    History of present illness pt is 84 y.o. female with PMHx of GERD, RA, DM, HTN, dementia found to have stroke. MRI brain reviewed which showed subacute stroke in left left anterior middle frontal gyrus and anterior operculum associated with linear susceptibility artifact on SWAN likely blood products. MRA showed no large vessel occlusion however diffuse intracranial cerebrovascular atherosclerosis and stenotic disease.   OT comments  OT treatment session with focus on bed mobility, functional transfers, and LUE NMR. Patient continues to be limited by decreased cognition with Hx of dementia at baseline, L-sided weakness, and aphasia indicating continued need for skilled acute OT services. Patient able to advance from supine to EOB and stand with Mod A +2 but unable to advance RLE during steps to recliner. Squat-pivot transfer completed for patient/therapist safety. Once seated in recliner, patient able to open milk with good bimanual manipulation and use fork to bring items to mouth. Post-acute CIR placement remains appropriate.    Follow Up Recommendations  CIR;Supervision/Assistance - 24 hour    Equipment Recommendations  Other (comment) (TBD)    Recommendations for Other Services Rehab consult    Precautions / Restrictions Precautions Precautions: Fall Restrictions Weight Bearing Restrictions: No       Mobility Bed Mobility Overal bed mobility: Needs Assistance Bed Mobility: Supine to Sit     Supine to sit: Mod assist     General bed mobility comments: HOB flat. Patient able to advance BLE toward EOB with assist to bring trunk upright. Heavy use of bedrail.  Transfers Overall transfer level: Needs assistance Equipment used: Rolling walker (2 wheeled)   Sit to Stand: Mod assist;+2 physical assistance Stand pivot transfers:  (Unable)        General transfer comment: Unable to advance RLE during stand-pivot transfer attempt. For patient/therapist safety, patient transferred via squat-pivot transfer.    Balance Overall balance assessment: Needs assistance Sitting-balance support: Feet supported;Single extremity supported Sitting balance-Leahy Scale: Fair Sitting balance - Comments: Patient able to maintain static sitting balance at EOB without UE support.   Standing balance support: Bilateral upper extremity supported Standing balance-Leahy Scale: Poor Standing balance comment: dependent on external support                           ADL either performed or assessed with clinical judgement   ADL Overall ADL's : Needs assistance/impaired Eating/Feeding: Set up;Sitting                       Toilet Transfer: Moderate assistance;+2 for physical assistance Toilet Transfer Details (indicate cue type and reason): Simulated with squat-pivot transfer to recliner. Unsuccessful attempt for stand-pivot transfer.                 Vision       Perception     Praxis      Cognition Arousal/Alertness: Awake/alert Behavior During Therapy: WFL for tasks assessed/performed Overall Cognitive Status: Impaired/Different from baseline Area of Impairment: Orientation;Following commands;Safety/judgement;Awareness;Problem solving                 Orientation Level: Place;Situation;Time     Following Commands: Follows one step commands consistently;Follows multi-step commands inconsistently Safety/Judgement: Decreased awareness of safety;Decreased awareness of deficits   Problem Solving: Slow processing;Requires verbal cues;Requires tactile cues          Exercises  Shoulder Instructions       General Comments      Pertinent Vitals/ Pain       Pain Assessment: No/denies pain  Home Living                                          Prior Functioning/Environment               Frequency  Min 2X/week        Progress Toward Goals  OT Goals(current goals can now be found in the care plan section)  Progress towards OT goals: Progressing toward goals  Acute Rehab OT Goals Patient Stated Goal: to get better OT Goal Formulation: With patient Time For Goal Achievement: 11/13/20 Potential to Achieve Goals: Good ADL Goals Pt Will Perform Grooming: with supervision;sitting;standing Pt Will Perform Upper Body Dressing: with supervision Pt Will Perform Lower Body Dressing: with supervision Pt Will Transfer to Toilet: with supervision;bedside commode;grab bars;regular height toilet Pt Will Perform Toileting - Clothing Manipulation and hygiene: with supervision;with adaptive equipment;sit to/from stand  Plan Discharge plan remains appropriate    Co-evaluation                 AM-PAC OT "6 Clicks" Daily Activity     Outcome Measure   Help from another person eating meals?: A Little Help from another person taking care of personal grooming?: A Little Help from another person toileting, which includes using toliet, bedpan, or urinal?: A Lot Help from another person bathing (including washing, rinsing, drying)?: A Lot Help from another person to put on and taking off regular upper body clothing?: A Little Help from another person to put on and taking off regular lower body clothing?: A Lot 6 Click Score: 15    End of Session Equipment Utilized During Treatment: Gait belt;Rolling walker  OT Visit Diagnosis: Unsteadiness on feet (R26.81);Hemiplegia and hemiparesis Hemiplegia - Right/Left: Left Hemiplegia - dominant/non-dominant: Non-Dominant Hemiplegia - caused by: Cerebral infarction   Activity Tolerance Patient tolerated treatment well   Patient Left in chair;with call bell/phone within reach;with chair alarm set   Nurse Communication Mobility status        Time: 1206-1229 OT Time Calculation (min): 23 min  Charges: OT General  Charges $OT Visit: 1 Visit OT Treatments $Self Care/Home Management : 8-22 mins $Therapeutic Activity: 8-22 mins  Brittany Triggs H. OTR/L Supplemental OT, Department of rehab services [phone]   Brittany Macdonald R H. 10/31/2020, 1:05 PM

## 2020-10-31 NOTE — Progress Notes (Addendum)
PROGRESS NOTE  Brittany Macdonald ZOX:[id number] DOB: 1936-01-08 DOA: 10/28/2020 PCP: [name], MD  Brief History   84 year old woman PMH including diabetes mellitus type 2, dementia presented with weakness subacute in nature.  Admitted for aphasia and left leg weakness secondary to CVA, subacute.  A & P  Subacute CVA with associated aphasia and left-sided weakness.  MRI showed subacute stroke, microhemorrhages.  MRI no large vessel occlusion.  Imaging suggested CAA.  EKG sinus rhythm, echocardiogram unremarkable --Goal bloodpressure less than 130/90, lipids less than 70 --Neurology recommended 30-day event monitor to rule out atrial fibrillation --Aspirin and Plavix for 3 weeks then aspirin alone.  Continue statin. --CIR recommended  Elevated blood pressure.   --remains elevated. Will start Norvasc.  Dementia suspected baseline --Appears stable  Diabetes mellitus type 2.  Hemoglobin A1c 7.4. --CBG remained stable.  Will continue Lantus, sliding scale insulin  Disposition Plan:  Discussion: Plan as above  Status is: Inpatient  Dispo: The patient is from:               Anticipated d/c is to: CIR              Anticipated d/c date is: 12/15              Patient currently not medically stable  DVT prophylaxis: SCDs Code Status: Full Code Family Communication: granddaughter by telephone  Murray Hodgkins, MD  [facility] Direct contact: see [email or web address] (further directions at bottom of note if needed) 7PM-7AM contact night coverage as at bottom of note 10/30/2020, 5:58 PM  LOS: 1 day   Significant Hospital Events   .    Consults:  . Neurology    Procedures:  .   Significant Diagnostic Tests:  Marland Kitchen    Micro Data:  .    Antimicrobials:  .   Interval History/Subjective  CC: f/u stroke  Feels fine, no complaints  Objective   Vitals:  Vitals:   10/30/20 1217 10/30/20 1620  BP: (!) 159/82 (!) 178/78  Pulse: 92 93  Resp: 15 15  Temp: (!) 96.6 F  (35.9 C) 98.4 F (36.9 C)  SpO2: 99% 100%    Exam:  Constitutional:   . Appears calm and comfortable ENMT:  . grossly normal hearing  Respiratory:  . CTA bilaterally, no w/r/r.  . Respiratory effort normal.  Cardiovascular:  . RRR, no m/r/g . No LE extremity edema   Musculoskeletal:  . RUE, LUE, RLE, LLE   . Moves all extremities to command Psychiatric:  . Mental status o Mood, affect appropriate . confused   I have personally reviewed the following:   Today's Data  . CBG stable  Scheduled Meds: .  stroke: mapping our early stages of recovery book   Does not apply Once  . aspirin EC  81 mg Oral Daily  . atorvastatin  40 mg Oral Daily  . enoxaparin (LOVENOX) injection  40 mg Subcutaneous Daily  . insulin aspart  0-9 Units Subcutaneous TID WC  . insulin glargine  10 Units Subcutaneous QHS   Continuous Infusions: . sodium chloride 75 mL/hr at 10/30/20 1014    Principal Problem:   CVA (cerebral vascular accident) [facility]) Active Problems:   Aphasia   Left leg weakness   Diabetes mellitus type 2, uncontrolled ([place])   Acute metabolic encephalopathy   Obesity, Class III, BMI 40-49.9 (morbid obesity) ([place])   LOS: 1 day   How to contact the [facility] Attending or Consulting provider 7A - 7P or covering  provider during after hours [place], for this patient?  1. Check the care team in [facility] and look for a) attending/consulting TRH provider listed and b) the [facility] team listed 2. Log into [email or web address] and use [place]'s universal password to access. If you do not have the password, please contact the hospital operator. 3. Locate the [facility] provider you are looking for under [facility] and page to a number that you can be directly reached. 4. If you still have difficulty reaching the provider, please page the [facility] (Director on Call) for the Hospitalists listed on amion for assistance.

## 2020-10-31 NOTE — Evaluation (Signed)
Speech Language Pathology Evaluation Patient Details Name: Brittany Macdonald MRN: [id number] DOB: [birth year] Today's Date: 10/31/2020 Time: 9937-1696 SLP Time Calculation (min) (ACUTE ONLY): 16.7 min  Problem List:  Patient Active Problem List   Diagnosis Date Noted  . Aphasia 10/29/2020  . Left leg weakness 10/29/2020  . CVA (cerebral vascular accident) ([place]) 10/29/2020  . Diabetes mellitus type 2, uncontrolled ([place]) 10/29/2020  . Acute metabolic encephalopathy 78/93/8101  . Obesity, Class III, BMI 40-49.9 (morbid obesity) ([place]) 10/29/2020  . Diverticulosis 07/12/2014  . Diarrhea 07/12/2014  . Diabetes mellitus without complication ([place])   . Hypertension   . Osteoarthritis   . Unspecified gastritis and gastroduodenitis without mention of hemorrhage 05/27/2014  . Liver mass 05/27/2014  . GI (gastrointestinal bleed) 05/27/2014  . GI bleeding 05/26/2014  . GI bleed 05/26/2014   Past Medical History:  Past Medical History:  Diagnosis Date  . Chronic neck pain   . Diabetes mellitus without complication ([place])   . Fibromyalgia   . GERD (gastroesophageal reflux disease)   . GI bleed 07/12/2014  . Hypertension   . Obesity   . Osteoarthritis   . Refusal of blood product   . Rheumatoid arthritis(714.0)    Past Surgical History:  Past Surgical History:  Procedure Laterality Date  . COLONOSCOPY N/A 05/28/2014   Procedure: COLONOSCOPY;  Surgeon: Juanita Craver, MD;  Location: [facility] ENDOSCOPY;  Service: Endoscopy;  Laterality: N/A;  . ESOPHAGOGASTRODUODENOSCOPY N/A 05/27/2014   Procedure: ESOPHAGOGASTRODUODENOSCOPY (EGD);  Surgeon: Milus Banister, MD;  Location: [place];  Service: Endoscopy;  Laterality: N/A;  . JOINT REPLACEMENT     bilat hips  . TOTAL HIP ARTHROPLASTY Bilateral   . TUBAL LIGATION     HPI:  84 y.o. female with PMHx of GERD, RA, DM, HTN, dementia found to have stroke. MRI brain reviewed which showed subacute stroke in left left anterior middle frontal gyrus and  anterior operculum associated with linear susceptibility artifact on SWAN likely blood products. MRA showed no large vessel occlusion however diffuse intracranial cerebrovascular atherosclerosis and stenotic disease. Chart review reveals inconsistent information about baseline communication abilities.   Assessment / Plan / Recommendation Clinical Impression  Pt presents with receptive and expressive aphasia complicated by baseline communication impairment (specifics unknown).  Output is borderline dysfluent, with frequent perseveratory errors.  Pt is able to engage in social communication appropriately. Confrontational naming reveals severe deficits without recognition; written cues help, however paraphasic errors are present when reading aloud.  She  is able to recite automatic sequences when given intial cue, however, she needs assist to shift between targets (eg., counting and then reciting DOW) otherwise she continues to perseverate on prior output.  She responds well to cues to cease perseveration and shift set.  Repetition of single words is intact; there are inaccuracies during sentence repetition. She follows one-step commands with 60% accuracy.  She is unable to accurately discriminate between left and right; she identifies body parts with 70% accuracy. Pt demonstrates responsiveness to verbal/written cues.  Recommend ongoing speech therapy in this venue of care and in [place].    SLP Assessment  SLP Recommendation/Assessment: Patient needs continued Speech Lanaguage Pathology Services SLP Visit Diagnosis: Aphasia (R47.01)    Follow Up Recommendations  Inpatient Rehab    Frequency and Duration min 2x/week  2 weeks      SLP Evaluation Cognition  Overall Cognitive Status: Impaired/Different from baseline Orientation Level: Oriented to person;Disoriented to place;Disoriented to time;Disoriented to situation       Comprehension  Auditory Comprehension Overall Auditory Comprehension:  Impaired Yes/No Questions: Impaired Basic Biographical Questions: 26-50% accurate Basic Immediate Environment Questions: 25-49% accurate Commands: Impaired One Step Basic Commands: 50-74% accurate Conversation: Simple Visual Recognition/Discrimination Discrimination: Exceptions to [facility] L/R Discrimination:  (poor left/right discrim) Common Objects: Able in field of 3 (impaired) Reading Comprehension Reading Status: Not tested    Expression Verbal Expression Overall Verbal Expression: Impaired Automatic Speech: Name;Social Response;Counting;Day of week;Month of year Level of Generative/Spontaneous Verbalization: Sentence Repetition: Impaired Level of Impairment: Sentence level Naming: Impairment Responsive: 0-25% accurate Confrontation: Impaired Common Objects: Able in field of 3 (impaired) Verbal Errors: Semantic paraphasias;Perseveration Pragmatics: No impairment Written Expression Dominant Hand: Right   Oral / Motor  Oral Motor/Sensory Function Overall Oral Motor/Sensory Function: Within functional limits Motor Speech Overall Motor Speech: Appears within functional limits for tasks assessed   GO                    Juan Quam Laurice 10/31/2020, 2:10 PM  Arik Husmann L. Tivis Ringer, [place] Office number [phone] Pager [phone]

## 2020-11-01 DIAGNOSIS — I10 Essential (primary) hypertension: Secondary | ICD-10-CM

## 2020-11-01 LAB — GLUCOSE, CAPILLARY
Glucose-Capillary: 75 mg/dL (ref 70–99)
Glucose-Capillary: 141 mg/dL — ABNORMAL HIGH (ref 70–99)
Glucose-Capillary: 152 mg/dL — ABNORMAL HIGH (ref 70–99)
Glucose-Capillary: 116 mg/dL — ABNORMAL HIGH (ref 70–99)

## 2020-11-01 MED ORDER — INFLUENZA VAC A&B SA ADJ QUAD 0.5 ML IM PRSY
0.5000 mL | PREFILLED_SYRINGE | INTRAMUSCULAR | Status: AC | PRN
  Administered 2020-11-03: 11:00:00 0.5 mL via INTRAMUSCULAR
  Filled 2020-11-01: qty 0.5

## 2020-11-01 NOTE — TOC Initial Note (Signed)
Transition of Care [facility]) - Initial/Assessment Note    Patient Details  Name: Brittany Macdonald MRN: [id number] Date of Birth: [birth year]  Transition of Care [facility]) CM/SW Contact:    Benard Halsted, LCSW Phone Number: 11/01/2020, 3:30 PM  Clinical Narrative:                 CSW received return call from patient's granddaughter. She reported that patient's daughter is currently unable to care for patient and granddaughter is working on [employer] legal documents to bring her home with her as she is not getting the care she needs. She expressed understanding of PT recommendation and is agreeable to SNF placement at time of discharge if CIR is unable to accept patient. CSW discussed insurance authorization process and provided Medicare SNF ratings list. Patient has not received the COVID vaccines and granddaughter understands that could limit SNF options. CSW emailed her SNF bed offers ([email or web address]).     Barriers to Discharge: Continued Medical Work up   Patient Goals and CMS Choice Patient states their goals for this hospitalization and ongoing recovery are:: Rehab CMS Medicare.gov Compare Post Acute Care list provided to:: Patient Represenative (must comment) Choice offered to / list presented to :  (Granddaughter)  Expected Discharge Plan and Services   In-house Referral: Clinical Social Work   Post Acute Care Choice: [place] Living arrangements for the past 2 months: Apartment                                      Prior Living Arrangements/Services Living arrangements for the past 2 months: Apartment Lives with:: Adult Children Patient language and need for interpreter reviewed:: Yes Do you feel safe going back to the place where you live?: No      Need for Family Participation in Patient Care: Yes (Comment) Care giver support system in place?: Yes (comment)   Criminal Activity/Legal Involvement Pertinent to Current  Situation/Hospitalization: No - Comment as needed  Activities of Daily Living      Permission Sought/Granted Permission sought to share information with : Facility Contact Representative,Family Supports Permission granted to share information with : No  Share Information with NAME: Maris Berger  Permission granted to share info w AGENCY: SNFs  Permission granted to share info w Relationship: Granddaughter  Permission granted to share info w Contact Information: [phone]  Emotional Assessment Appearance:: Appears stated age Attitude/Demeanor/Rapport: Unable to Assess Affect (typically observed): Unable to Assess Orientation: : Oriented to Self Alcohol / Substance Use: Not Applicable Psych Involvement: No (comment)  Admission diagnosis:  Aphasia [R47.01] Weakness [R53.1] Encounter for imaging to screen for metal prior to MRI [Z13.89] Patient Active Problem List   Diagnosis Date Noted  . Aphasia 10/29/2020  . Left leg weakness 10/29/2020  . CVA (cerebral vascular accident) ([place]) 10/29/2020  . Diabetes mellitus type 2, uncontrolled ([place]) 10/29/2020  . Acute metabolic encephalopathy 13/24/4010  . Obesity, Class III, BMI 40-49.9 (morbid obesity) ([place]) 10/29/2020  . Diverticulosis 07/12/2014  . Diarrhea 07/12/2014  . Diabetes mellitus without complication ([place])   . Hypertension   . Osteoarthritis   . Unspecified gastritis and gastroduodenitis without mention of hemorrhage 05/27/2014  . Liver mass 05/27/2014  . GI (gastrointestinal bleed) 05/27/2014  . GI bleeding 05/26/2014  . GI bleed 05/26/2014   PCP:  [name], MD Pharmacy:   [id number] - [place], [place]  [place] [address] Phone: [phone] Fax: [phone]     Social Determinants of Health (SDOH) Interventions    Readmission Risk Interventions No flowsheet data found.

## 2020-11-01 NOTE — [place] FL2 (Signed)
[place] MEDICAID FL2 LEVEL OF CARE SCREENING TOOL     IDENTIFICATION  Patient Name: Brittany Macdonald Birthdate: 14-Jun-1936 Sex: female Admission Date (Current Location): 10/28/2020  [facility] and [place] Number:  Herbalist and Address:  [facility], [place] [address], [place], [place] [address]      Provider Number: [id number]  Attending Physician Name and Address:  Lavina Hamman, MD  Relative Name and Phone Number:  Maris Berger granddaughter, [phone]    Current Level of Care: Hospital Recommended Level of Care: [place] Prior Approval Number:    Date Approved/Denied:   PASRR Number: [id number]  Discharge Plan: SNF    Current Diagnoses: Patient Active Problem List   Diagnosis Date Noted  . Aphasia 10/29/2020  . Left leg weakness 10/29/2020  . CVA (cerebral vascular accident) ([place]) 10/29/2020  . Diabetes mellitus type 2, uncontrolled ([place]) 10/29/2020  . Acute metabolic encephalopathy 34/28/7681  . Obesity, Class III, BMI 40-49.9 (morbid obesity) ([place]) 10/29/2020  . Diverticulosis 07/12/2014  . Diarrhea 07/12/2014  . Diabetes mellitus without complication ([place])   . Hypertension   . Osteoarthritis   . Unspecified gastritis and gastroduodenitis without mention of hemorrhage 05/27/2014  . Liver mass 05/27/2014  . GI (gastrointestinal bleed) 05/27/2014  . GI bleeding 05/26/2014  . GI bleed 05/26/2014    Orientation RESPIRATION BLADDER Height & Weight     Self  Normal Incontinent,External catheter Weight: 174 lb 6.1 oz (79.1 kg) Height:     BEHAVIORAL SYMPTOMS/MOOD NEUROLOGICAL BOWEL NUTRITION STATUS      Continent Diet (Please see DC Summary)  AMBULATORY STATUS COMMUNICATION OF NEEDS Skin   Extensive Assist Verbally Normal                       Personal Care Assistance Level of Assistance  Bathing,Feeding,Dressing Bathing Assistance: Maximum assistance Feeding assistance: Limited  assistance Dressing Assistance: Limited assistance     Functional Limitations Info  Sight,Hearing,Speech Sight Info: Adequate Hearing Info: Adequate Speech Info: Adequate    SPECIAL CARE FACTORS FREQUENCY  PT (By licensed PT),OT (By licensed OT)     PT Frequency: 5x/week OT Frequency: 5x/week            Contractures Contractures Info: Not present    Additional Factors Info  Code Status,Allergies,Insulin Sliding Scale Code Status Info: Full Allergies Info: Penicillins, Ivp Dye (Iodinated Diagnostic Agents), Metoprolol, Peanut-containing Drug Products   Insulin Sliding Scale Info: See dc summary       Current Medications (11/01/2020):  This is the current hospital active medication list Current Facility-Administered Medications  Medication Dose Route Frequency Provider Last Rate Last Admin  .  stroke: mapping our early stages of recovery book   Does not apply Once Fuller Plan A, MD      . acetaminophen (TYLENOL) tablet 650 mg  650 mg Oral Q4H PRN Norval Morton, MD       Or  . acetaminophen (TYLENOL) 160 MG/5ML solution 650 mg  650 mg Per Tube Q4H PRN Fuller Plan A, MD       Or  . acetaminophen (TYLENOL) suppository 650 mg  650 mg Rectal Q4H PRN Fuller Plan A, MD      . amLODipine (NORVASC) tablet 5 mg  5 mg Oral Daily Samuella Cota, MD   5 mg at 11/01/20 1572  . aspirin EC tablet 81 mg  81 mg Oral Daily Smith, Rondell A, MD   81 mg at  11/01/20 0927  . atorvastatin (LIPITOR) tablet 40 mg  40 mg Oral Daily Fuller Plan A, MD   40 mg at 11/01/20 0927  . clopidogrel (PLAVIX) tablet 75 mg  75 mg Oral Daily Rosalin Hawking, MD   75 mg at 11/01/20 6761  . insulin aspart (novoLOG) injection 0-9 Units  0-9 Units Subcutaneous TID WC Norval Morton, MD   2 Units at 10/31/20 1656  . insulin glargine (LANTUS) injection 10 Units  10 Units Subcutaneous QHS Norval Morton, MD   10 Units at 10/31/20 2144  . labetalol (NORMODYNE) injection 5 mg  5 mg Intravenous Q2H PRN  Samuella Cota, MD   5 mg at 11/01/20 0931  . senna-docusate (Senokot-S) tablet 1 tablet  1 tablet Oral QHS PRN Norval Morton, MD         Discharge Medications: Please see discharge summary for a list of discharge medications.  Relevant Imaging Results:  Relevant Lab Results:   Additional Information SSN: [id number]. Not covid vaccinated.  Benard Halsted, LCSW

## 2020-11-01 NOTE — Progress Notes (Signed)
[facility] Progress Note  Patient: Brittany Macdonald    KKX:[id number]  DOA: 10/28/2020     Date of Service: the patient was seen and examined on 11/01/2020  Brief hospital course: Past medical history of HTN, type II DM, fibromyalgia, GI bleed, GERD, dementia, OA and RA. Presents with complaints of generalized weakness. Found to have subacute stroke. Currently plan is arrange for transfer to SNF.  Assessment and Plan: 1. Left MCA infarct Petechial hemorrhage Hyperlipidemia Neurology consulted. CT head unremarkable. MRI brain shows subacute left anterior CVA with petechial hemorrhage. MRI also shows moderate stenosis of left M3 A2 and PCA. 2D echo shows EF of 55 to 60%. LDL 201. Currently on Lipitor 40 mg. Neurology recommends 30-day cardiac event monitoring outpatient to rule out A. fib. Currently on 81 mg aspirin and Plavix 75 mg. Dual antiplatelet therapy for 3 weeks followed by aspirin alone.  2. Essential hypertension Blood pressure stable. Continue to monitor.  3. Type 2 diabetes mellitus, uncontrolled with hyperglycemia with hyperlipidemia Hemoglobin A1c 7.4. Currently on sliding scale insulin. Monitor.  4. Fibromyalgia Osteoarthritis Rheumatoid arthritis Not on any medication for her arthritis. Unsure whether this diagnosis is accurate. Bilateral knee pain likely osteoarthritis. Continue as needed Tylenol. No trauma seen from the outside therefore no imaging for now.  5. Dementia No behavioral issues. Appears to be baseline. Continue to monitor. Not on medication outpatient.  6. Disposition. PT OT recommends CIR. Currently CIR following up. Backup SNF work-up initiated as well.  Diet: Carb modified diet DVT Prophylaxis:   Place and maintain sequential compression device Start: 10/30/20 1811    Advance goals of care discussion: Full code  Family Communication: no family was present at bedside, at the time of interview.   Disposition:   Status is: Inpatient  Remains inpatient appropriate because:Unsafe d/c plan   Dispo: The patient is from: Home              Anticipated d/c is to: CIR              Anticipated d/c date is: 1 day              Patient currently is medically stable to d/c.        Subjective: No nausea no vomiting. No fever no chills. Remains confused. Bilateral knee pain reported. No diarrhea.  Physical Exam:  General: Appear in mild distress, no Rash; Oral Mucosa Clear, moist. no Abnormal Neck Mass Or lumps, Conjunctiva normal  Cardiovascular: S1 and S2 Present, no Murmur, Respiratory: good respiratory effort, Bilateral Air entry present and CTA, no Crackles, no wheezes Abdomen: Bowel Sound present, Soft and no tenderness Extremities: no Pedal edema Neurology: alert and not oriented to time, place, and person affect appropriate. no new focal deficit Gait not checked due to patient safety concerns  Vitals:   11/01/20 0500 11/01/20 0814 11/01/20 1130 11/01/20 1613  BP:  (!) 150/80 (!) 108/56 133/77  Pulse:  92 73 85  Resp:  20 16 16   Temp:  97.8 F (36.6 C) 97.6 F (36.4 C) 98.3 F (36.8 C)  TempSrc:  Oral Oral Oral  SpO2:  91% 96% 99%  Weight: 79.1 kg       Intake/Output Summary (Last 24 hours) at 11/01/2020 1807 Last data filed at 11/01/2020 0800 Gross per 24 hour  Intake 240 ml  Output --  Net 240 ml   Filed Weights   10/31/20 0500 10/31/20 0600 11/01/20 0500  Weight: 78.1 kg 78.1 kg 79.1  kg    Data Reviewed: I have personally reviewed and interpreted daily labs, tele strips, imagings as discussed above. I reviewed all nursing notes, pharmacy notes, vitals, pertinent old records I have discussed plan of care as described above with RN and patient/family.  CBC: Recent Labs  Lab 10/28/20 1749 10/29/20 1043  WBC 8.1 8.0  NEUTROABS  --  5.2  HGB 12.5 12.3  HCT 37.2 38.3  MCV 90.5 91.6  PLT 199 161   Basic Metabolic Panel: Recent Labs  Lab 10/28/20 1749  NA 138   K 4.0  CL 101  CO2 25  GLUCOSE 263*  BUN 17  CREATININE 0.87  CALCIUM 9.1    Studies: No results found.  Scheduled Meds: .  stroke: mapping our early stages of recovery book   Does not apply Once  . amLODipine  5 mg Oral Daily  . aspirin EC  81 mg Oral Daily  . atorvastatin  40 mg Oral Daily  . clopidogrel  75 mg Oral Daily  . insulin aspart  0-9 Units Subcutaneous TID WC  . insulin glargine  10 Units Subcutaneous QHS   Continuous Infusions: PRN Meds: acetaminophen **OR** acetaminophen (TYLENOL) oral liquid 160 mg/5 mL **OR** acetaminophen, influenza vaccine adjuvanted, labetalol, senna-docusate  Time spent: 35 minutes  Author: Berle Mull, MD [facility] 11/01/2020 6:07 PM  To reach On-call, see care teams to locate the attending and reach out via [email or web address]. Between 7PM-7AM, please contact night-coverage If you still have difficulty reaching the attending provider, please page the [facility] (Director on Call) for [facility] on amion for assistance.

## 2020-11-01 NOTE — Plan of Care (Signed)
  Problem: Health Behavior/Discharge Planning: Goal: Ability to manage health-related needs will improve Outcome: Progressing   Problem: Clinical Measurements: Goal: Will remain free from infection Outcome: Progressing   Problem: Education: Goal: Knowledge of disease or condition will improve Outcome: Progressing Goal: Knowledge of secondary prevention will improve Outcome: Progressing Goal: Knowledge of patient specific risk factors addressed and post discharge goals established will improve Outcome: Progressing

## 2020-11-01 NOTE — Progress Notes (Signed)
Physical Therapy Treatment Patient Details Name: Brittany Macdonald MRN: [id number] DOB: 1936-09-07 Today's Date: 11/01/2020    History of Present Illness pt is 84 y.o. female with PMHx of GERD, RA, DM, HTN, dementia found to have stroke. MRI brain reviewed which showed subacute stroke in left left anterior middle frontal gyrus and anterior operculum associated with linear susceptibility artifact on SWAN likely blood products. MRA showed no large vessel occlusion however diffuse intracranial cerebrovascular atherosclerosis and stenotic disease.    PT Comments    Pt tolerates treatment well, performing multiple transfer attempts this session. Pt demonstrates improved use of LLE although still remaining weak compared to RLE. Pt is able to stand with BUE support of PT or RW, does require significant verbal and tactile cueing to facilitate transfers and pivoting at this time. Pt will continue to benefit from acute PT POC to improve mobility quality and to reduce falls risk. PT continues to recommend CIR placement at this time.   Follow Up Recommendations  CIR     Equipment Recommendations  Wheelchair (measurements PT);Wheelchair cushion (measurements PT) (mechanical lift, if home today)    Recommendations for Other Services       Precautions / Restrictions Precautions Precautions: Fall Precaution Comments: expressive aphasia, dementia Restrictions Weight Bearing Restrictions: No    Mobility  Bed Mobility Overal bed mobility: Needs Assistance Bed Mobility: Supine to Sit     Supine to sit: Max assist;HOB elevated     General bed mobility comments: pt requires assist for LLE advancement and to pivot hips toward edge of bed  Transfers Overall transfer level: Needs assistance Equipment used: Rolling walker (2 wheeled);1 person hand held assist Transfers: Sit to/from [facility] Sit to Stand: Mod assist;Max assist Stand pivot transfers: Max assist       General  transfer comment: modA from elevated surface with BUE support of PT, maxA from recliner with use of RW  Ambulation/Gait Ambulation/Gait assistance: Max assist Gait Distance (Feet): 0 Feet Assistive device: Rolling walker (2 wheeled) Gait Pattern/deviations: Step-to pattern Gait velocity: 0   General Gait Details: pt able to lift LLE off ground to step, unable to step with RLE due to pain and weakness in LLE   Stairs             Wheelchair Mobility    Modified Rankin (Stroke Patients Only) Modified Rankin (Stroke Patients Only) Pre-Morbid Rankin Score: Moderate disability Modified Rankin: Severe disability     Balance Overall balance assessment: Needs assistance Sitting-balance support: Feet supported;Single extremity supported Sitting balance-Leahy Scale: Poor Sitting balance - Comments: reliant on UE support for balance   Standing balance support: Bilateral upper extremity supported Standing balance-Leahy Scale: Zero Standing balance comment: mod-maxA with BUE support                            Cognition Arousal/Alertness: Awake/alert Behavior During Therapy: WFL for tasks assessed/performed Overall Cognitive Status: History of cognitive impairments - at baseline Area of Impairment: Orientation;Attention;Memory;Safety/judgement;Following commands;Awareness;Problem solving                 Orientation Level: Disoriented to;Situation Current Attention Level: Sustained Memory: Decreased recall of precautions;Decreased short-term memory Following Commands: Follows one step commands consistently;Follows multi-step commands with increased time Safety/Judgement: Decreased awareness of safety;Decreased awareness of deficits Awareness: Intellectual Problem Solving: Slow processing;Decreased initiation;Difficulty sequencing        Exercises General Exercises - Lower Extremity Ankle Circles/Pumps: AROM;Both;10 reps Gluteal Sets: AROM;Both;5  reps Heel  Slides: AROM;Both Straight Leg Raises: AROM;Both    General Comments General comments (skin integrity, edema, etc.): VSS on RA      Pertinent Vitals/Pain Pain Assessment: Faces Faces Pain Scale: Hurts little more Pain Location: BLE Pain Descriptors / Indicators: Grimacing Pain Intervention(s): Monitored during session    Home Living                      Prior Function            PT Goals (current goals can now be found in the care plan section) Progress towards PT goals: Progressing toward goals    Frequency    Min 4X/week      PT Plan Current plan remains appropriate    Co-evaluation              AM-PAC PT "6 Clicks" Mobility   Outcome Measure  Help needed turning from your back to your side while in a flat bed without using bedrails?: A Lot Help needed moving from lying on your back to sitting on the side of a flat bed without using bedrails?: A Lot Help needed moving to and from a bed to a chair (including a wheelchair)?: A Lot Help needed standing up from a chair using your arms (e.g., wheelchair or bedside chair)?: A Lot Help needed to walk in hospital room?: Total Help needed climbing 3-5 steps with a railing? : Total 6 Click Score: 10    End of Session   Activity Tolerance: Patient tolerated treatment well Patient left: in chair;with call bell/phone within reach;with chair alarm set Nurse Communication: Mobility status PT Visit Diagnosis: Unsteadiness on feet (R26.81);Muscle weakness (generalized) (M62.81);Difficulty in walking, not elsewhere classified (R26.2)     Time: 7989-2119 PT Time Calculation (min) (ACUTE ONLY): 23 min  Charges:  $Therapeutic Activity: 23-37 mins                     Zenaida Niece, PT, DPT Acute Rehabilitation Pager: [phone]    Zenaida Niece 11/01/2020, 3:32 PM

## 2020-11-01 NOTE — Progress Notes (Signed)
I spoke with patient's granddaughter by phone to discuss goals and expectations of a possible Cir admit. She states that her Aunt was caring for her grandmother, but her plans are for patient to come live with her and she will provide 24/7 care as needed.  Granddaughter is aware that I can not assure her that I will have a bed in the next 24 to 48 hrs, and if not, other rehab venues will need to be explored.  Danne Baxter, RN, MSN Rehab Admissions Coordinator [phone] 11/01/2020 1:13 PM

## 2020-11-01 NOTE — Progress Notes (Signed)
CSW left voicemail for patient's granddaughter regarding discharge planning and potential need for SNF if CIR unable to accept patient.   Shashwat Cleary LCSW

## 2020-11-02 LAB — MAGNESIUM: Magnesium: 1.9 mg/dL (ref 1.7–2.4)

## 2020-11-02 LAB — COMPREHENSIVE METABOLIC PANEL
ALT: 9 U/L (ref 0–44)
AST: 14 U/L — ABNORMAL LOW (ref 15–41)
Albumin: 2.5 g/dL — ABNORMAL LOW (ref 3.5–5.0)
Alkaline Phosphatase: 50 U/L (ref 38–126)
Anion gap: 8 (ref 5–15)
BUN: 8 mg/dL (ref 8–23)
CO2: 26 mmol/L (ref 22–32)
Calcium: 8.8 mg/dL — ABNORMAL LOW (ref 8.9–10.3)
Chloride: 105 mmol/L (ref 98–111)
Creatinine, Ser: 0.59 mg/dL (ref 0.44–1.00)
GFR, Estimated: >60 mL/min (ref >60)
Glucose, Bld: 80 mg/dL (ref 70–99)
Potassium: 3.4 mmol/L — ABNORMAL LOW (ref 3.5–5.1)
Sodium: 139 mmol/L (ref 135–145)
Total Bilirubin: 0.7 mg/dL (ref 0.3–1.2)
Total Protein: 5.8 g/dL — ABNORMAL LOW (ref 6.5–8.1)

## 2020-11-02 LAB — GLUCOSE, CAPILLARY
Glucose-Capillary: 72 mg/dL (ref 70–99)
Glucose-Capillary: 116 mg/dL — ABNORMAL HIGH (ref 70–99)
Glucose-Capillary: 136 mg/dL — ABNORMAL HIGH (ref 70–99)
Glucose-Capillary: 123 mg/dL — ABNORMAL HIGH (ref 70–99)
Glucose-Capillary: 179 mg/dL — ABNORMAL HIGH (ref 70–99)

## 2020-11-02 LAB — SARS CORONAVIRUS 2 BY RT PCR (HOSPITAL ORDER, PERFORMED IN ~~LOC~~ HOSPITAL LAB): SARS Coronavirus 2: NEGATIVE

## 2020-11-02 MED ORDER — POTASSIUM CHLORIDE CRYS ER 20 MEQ PO TBCR
40.0000 meq | EXTENDED_RELEASE_TABLET | Freq: Once | ORAL | Status: AC
  Administered 2020-11-02: 09:00:00 40 meq via ORAL
  Filled 2020-11-02: qty 2

## 2020-11-02 NOTE — Progress Notes (Signed)
°  Speech Language Pathology Treatment: Cognitive-Linquistic  Patient Details Name: Brittany Macdonald MRN: [id number] DOB: 1936-11-17 Today's Date: 11/02/2020 Time: 0109-3235 SLP Time Calculation (min) (ACUTE ONLY): 14 min  Assessment / Plan / Recommendation Clinical Impression  Granddaughter and niece present for session.  Today Brittany Macdonald demonstrated continued strengths in social communication.  She was able to name to confrontation with provision of written cue, however then perseverated on initial production when target word changed. She required verbal modeling in order to shift output.  She produced automatic sequences with initial accuracy, then partway through production would name family members.  She demonstrated improved ability to follow one-step commands (70% acc); 30% of time required visual model. Her granddaughter confirmed prior memory deficits and occasional perseveration, but not the degree of word-finding issues that she is dealing with now.  Pt will benefit from ongoing aphasia therapy upon D/C to SNF rehab.    HPI HPI: 84 y.o. female with PMHx of GERD, RA, DM, HTN, dementia found to have stroke. MRI brain reviewed which showed subacute stroke in left left anterior middle frontal gyrus and anterior operculum associated with linear susceptibility artifact on SWAN likely blood products. MRA showed no large vessel occlusion however diffuse intracranial cerebrovascular atherosclerosis and stenotic disease. Chart review reveals inconsistent information about baseline communication abilities.      SLP Plan  Continue with current plan of care       Recommendations                   Follow up Recommendations: Skilled Nursing facility SLP Visit Diagnosis: Aphasia (R47.01) Plan: Continue with current plan of care       [place]. Tivis Ringer, [place] CCC/SLP Acute Rehabilitation Services Office number [phone] Pager [phone]  Brittany Macdonald  Brittany Macdonald 11/02/2020, 1:38 PM

## 2020-11-02 NOTE — Progress Notes (Signed)
Inpatient Rehabilitation Admissions Coordinator  I will not have a CIR bed for this patient likely this week. I have alerted acute team and TOC of need to pursue other rehab venue options.  Danne Baxter, RN, MSN Rehab Admissions Coordinator [phone] 11/02/2020 10:21 AM

## 2020-11-02 NOTE — TOC Progression Note (Signed)
Transition of Care [facility]) - Progression Note    Patient Details  Name: Brittany Macdonald MRN: [id number] Date of Birth: 11-Dec-1935  Transition of [facility]) CM/SW Contact  Pollie Friar, RN Phone Number: 11/02/2020, 3:27 PM  Clinical Narrative:    Granddaughter seleted [place] for rehab. CM reach out to [facility] in admissions and they will have a bed for her tomorrow. MD and granddaughter updated.  Covid ordered.  TOC following.     Barriers to Discharge: Continued Medical Work up  Expected Discharge Plan and Services   In-house Referral: Clinical Social Work   Post Acute Care Choice: [place] Living arrangements for the past 2 months: Apartment                                       Social Determinants of Health (SDOH) Interventions    Readmission Risk Interventions No flowsheet data found.

## 2020-11-02 NOTE — Care Management Important Message (Signed)
Important Message  Patient Details  Name: Brittany Macdonald MRN: [id number] Date of Birth: June 17, 1936   Medicare Important Message Given:  Yes     Orbie Pyo 11/02/2020, 4:02 PM

## 2020-11-02 NOTE — Progress Notes (Signed)
[facility] Progress Note  Patient: Brittany Macdonald    BSW:[id number]  DOA: 10/28/2020     Date of Service: the patient was seen and examined on 11/02/2020  Brief hospital course: Past medical history of HTN, type II DM, fibromyalgia, GI bleed, GERD, dementia, OA and RA. Presents with complaints of generalized weakness. Found to have subacute stroke. Currently plan is transfer to SNF tomorrow.  Assessment and Plan: 1. Left MCA infarct Petechial hemorrhage Hyperlipidemia Neurology consulted. CT head unremarkable. MRI brain shows subacute left anterior CVA with petechial hemorrhage. MRI also shows moderate stenosis of left M3 A2 and PCA. 2D echo shows EF of 55 to 60%. LDL 201. Currently on Lipitor 40 mg. Neurology recommends 30-day cardiac event monitoring outpatient to rule out A. fib. Currently on 81 mg aspirin and Plavix 75 mg. Dual antiplatelet therapy for 3 weeks followed by aspirin alone.  2. Essential hypertension Blood pressure stable. Continue to monitor.  3. Type 2 diabetes mellitus, uncontrolled with hyperglycemia with hyperlipidemia Hemoglobin A1c 7.4. Currently on sliding scale insulin. Monitor.  4. Fibromyalgia Osteoarthritis Rheumatoid arthritis Not on any medication for her arthritis. Unsure whether this diagnosis is accurate. Bilateral knee pain likely osteoarthritis. Continue as needed Tylenol. No trauma seen from the outside therefore no imaging for now.  5. Dementia No behavioral issues. Appears to be baseline.  Remains confused and has some tangential thoughts. Continue to monitor. Not on medication outpatient.  6. Disposition. SNF tomorrow  Diet: Carb modified diet DVT Prophylaxis:   Place and maintain sequential compression device Start: 10/30/20 1811    Advance goals of care discussion: Full code  Family Communication: no family was present at bedside, at the time of interview.   Disposition:  Status is:  Inpatient  Remains inpatient appropriate because:Unsafe d/c plan   Dispo: The patient is from: Home              Anticipated d/c is to: SNF              Anticipated d/c date is: 1 day              Patient currently is medically stable to d/c.  Subjective: No nausea no vomiting.  No fever no chills.  No acute complaint.  Knee pain resolved.  Physical Exam:  General: Appear in mild distress, no Rash; Oral Mucosa Clear, moist. no Abnormal Neck Mass Or lumps, Conjunctiva normal  Cardiovascular: S1 and S2 Present, no Murmur, Respiratory: good respiratory effort, Bilateral Air entry present and CTA, no Crackles, no wheezes Abdomen: Bowel Sound present, Soft and no tenderness Extremities: no Pedal edema Neurology: alert and oriented to place and person affect appropriate. no new focal deficit Gait not checked due to patient safety concerns  Vitals:   11/02/20 0443 11/02/20 0817 11/02/20 1234 11/02/20 1540  BP: 128/71 131/73 130/71 115/63  Pulse: 67 75 73 73  Resp:   16 (!) 24  Temp:  97.8 F (36.6 C) 99.1 F (37.3 C) 98.7 F (37.1 C)  TempSrc:  Oral Oral Oral  SpO2:  100% 100% 100%  Weight:        Intake/Output Summary (Last 24 hours) at 11/02/2020 1903 Last data filed at 11/02/2020 1600 Gross per 24 hour  Intake 560 ml  Output 900 ml  Net -340 ml   Filed Weights   10/31/20 0600 11/01/20 0500 11/02/20 0417  Weight: 78.1 kg 79.1 kg 79.7 kg    Data Reviewed: I have personally reviewed and interpreted daily  labs, tele strips, imagings as discussed above. I reviewed all nursing notes, pharmacy notes, vitals, pertinent old records I have discussed plan of care as described above with RN and patient/family.  CBC: Recent Labs  Lab 10/28/20 1749 10/29/20 1043  WBC 8.1 8.0  NEUTROABS  --  5.2  HGB 12.5 12.3  HCT 37.2 38.3  MCV 90.5 91.6  PLT 199 005   Basic Metabolic Panel: Recent Labs  Lab 10/28/20 1749 11/02/20 0220  NA 138 139  K 4.0 3.4*  CL 101 105  CO2  25 26  GLUCOSE 263* 80  BUN 17 8  CREATININE 0.87 0.59  CALCIUM 9.1 8.8*  MG  --  1.9    Studies: No results found.  Scheduled Meds: .  stroke: mapping our early stages of recovery book   Does not apply Once  . amLODipine  5 mg Oral Daily  . aspirin EC  81 mg Oral Daily  . atorvastatin  40 mg Oral Daily  . clopidogrel  75 mg Oral Daily  . insulin aspart  0-9 Units Subcutaneous TID WC  . insulin glargine  10 Units Subcutaneous QHS   Continuous Infusions: PRN Meds: acetaminophen **OR** acetaminophen (TYLENOL) oral liquid 160 mg/5 mL **OR** acetaminophen, influenza vaccine adjuvanted, labetalol, senna-docusate  Time spent: 35 minutes  Author: Berle Mull, MD [facility] 11/02/2020 7:03 PM  To reach On-call, see care teams to locate the attending and reach out via [email or web address]. Between 7PM-7AM, please contact night-coverage If you still have difficulty reaching the attending provider, please page the [facility] (Director on Call) for [facility] on amion for assistance.

## 2020-11-02 NOTE — Progress Notes (Addendum)
Per telemetry pt had 6 beats of VT, chrg nurse Lannette Donath informed MD. This RN assessed pt which was asymptomatic and vitals norm.

## 2020-11-02 NOTE — Progress Notes (Signed)
Physical Therapy Treatment Patient Details Name: Brittany Macdonald MRN: [id number] DOB: April 24, 1936 Today's Date: 11/02/2020    History of Present Illness pt is 84 y.o. female with PMHx of GERD, RA, DM, HTN, dementia found to have stroke. MRI brain reviewed which showed subacute stroke in left left anterior middle frontal gyrus and anterior operculum associated with linear susceptibility artifact on SWAN likely blood products. MRA showed no large vessel occlusion however diffuse intracranial cerebrovascular atherosclerosis and stenotic disease.    PT Comments    Pt with improved activity tolerance, transferring multiple times and participating in pre-gait activities. Pt shows improvement in sit to stand quality, requiring less physical assistance to power up into standing. Pt does continue to demonstrate an impaired ability to advance RLE during standing, impairing her ability to ambulate. PT remains unsure if this is pain related or possibly motor planning related, the pt is inconsistent with reports of pain during session but demonstrates a preference for R lean and becomes more anxious with PT assistance toward a L lateral lean. Pt will continue to benefit from acute PT POC to improve mobility quality and reduce falls risk. PT recommends CIR vs SNF pending bed availability.  Follow Up Recommendations  CIR;SNF (CIR vs SNF pending bed availability)     Equipment Recommendations  Wheelchair (measurements PT);Wheelchair cushion (measurements PT)    Recommendations for Other Services       Precautions / Restrictions Precautions Precautions: Fall Precaution Comments: expressive aphasia, dementia Restrictions Weight Bearing Restrictions: No    Mobility  Bed Mobility Overal bed mobility: Needs Assistance Bed Mobility: Supine to Sit     Supine to sit: Min assist        Transfers Overall transfer level: Needs assistance Equipment used: Rolling walker (2 wheeled);1 person hand held  assist Transfers: Sit to/from [facility] Sit to Stand: Min assist;Mod assist Stand pivot transfers: Max assist       General transfer comment: maxA SPT with BUE support of PT, requires PT assist to step and pivot. Pt requiring modA initially to stand with RW progressing to minA, cues for hand placement  Ambulation/Gait Ambulation/Gait assistance: Mod assist Gait Distance (Feet): 0 Feet Assistive device: Rolling walker (2 wheeled) Gait Pattern/deviations: Step-to pattern Gait velocity: 0   General Gait Details: pt able to take step forward for 1' with LLE, unable to clear R foot despite multi-modal cueing. PT attempts to facilitate weight shift toward L side however pt with preference for R lateral lean.   Stairs             Wheelchair Mobility    Modified Rankin (Stroke Patients Only) Modified Rankin (Stroke Patients Only) Pre-Morbid Rankin Score: Moderate disability Modified Rankin: Severe disability     Balance Overall balance assessment: Needs assistance Sitting-balance support: No upper extremity supported;Feet supported Sitting balance-Leahy Scale: Fair     Standing balance support: Bilateral upper extremity supported Standing balance-Leahy Scale: Poor Standing balance comment: min-modA with BUE support of RW                            Cognition Arousal/Alertness: Awake/alert Behavior During Therapy: Anxious Overall Cognitive Status: History of cognitive impairments - at baseline Area of Impairment: Orientation;Attention;Memory;Following commands;Safety/judgement;Awareness;Problem solving                 Orientation Level: Disoriented to;Situation Current Attention Level: Sustained Memory: Decreased recall of precautions;Decreased short-term memory Following Commands: Follows one step commands with increased  time;Follows multi-step commands inconsistently Safety/Judgement: Decreased awareness of safety;Decreased  awareness of deficits Awareness: Intellectual Problem Solving: Slow processing;Difficulty sequencing;Decreased initiation;Requires verbal cues;Requires tactile cues        Exercises      General Comments General comments (skin integrity, edema, etc.): VSS on RA      Pertinent Vitals/Pain Pain Assessment: Faces Faces Pain Scale: Hurts a little bit Pain Location: BLE, inconsistent reports of pain in BLE during session beteween left and right leg intermittently Pain Descriptors / Indicators: Grimacing Pain Intervention(s): Monitored during session    Home Living                      Prior Function            PT Goals (current goals can now be found in the care plan section) Acute Rehab PT Goals Patient Stated Goal: to get better Progress towards PT goals: Progressing toward goals (slowly)    Frequency    Min 4X/week      PT Plan Current plan remains appropriate    Co-evaluation              AM-PAC PT "6 Clicks" Mobility   Outcome Measure  Help needed turning from your back to your side while in a flat bed without using bedrails?: A Little Help needed moving from lying on your back to sitting on the side of a flat bed without using bedrails?: A Little Help needed moving to and from a bed to a chair (including a wheelchair)?: A Lot Help needed standing up from a chair using your arms (e.g., wheelchair or bedside chair)?: A Lot Help needed to walk in hospital room?: Total Help needed climbing 3-5 steps with a railing? : Total 6 Click Score: 12    End of Session   Activity Tolerance: Patient tolerated treatment well Patient left: in chair;with call bell/phone within reach;with chair alarm set;with family/visitor present Nurse Communication: Mobility status;Need for lift equipment (STEDY for transfers) PT Visit Diagnosis: Unsteadiness on feet (R26.81);Muscle weakness (generalized) (M62.81);Difficulty in walking, not elsewhere classified (R26.2)      Time: 4142-3953 PT Time Calculation (min) (ACUTE ONLY): 27 min  Charges:  $Gait Training: 8-22 mins $Therapeutic Activity: 8-22 mins                     Zenaida Niece, PT, DPT Acute Rehabilitation Pager: [phone]    Zenaida Niece 11/02/2020, 3:36 PM

## 2020-11-02 NOTE — Progress Notes (Addendum)
Received a call from telemetry pt had a 20 beat run of Vtach. This nurse went to go check on pt. Pt complained of chest pain and shortness of breath, visible appeared uncomfortable and increased work of breathing. Vital Signs below. 12 lead EKG completed and in patient's chart. MD Posey Pronto notified.   11/02/20 1540  Vitals  Temp 98.7 F (37.1 C)  Temp Source Oral  BP 115/63  MAP (mmHg) 78  BP Location Right Arm  BP Method Automatic  Patient Position (if appropriate) Sitting  Pulse Rate 73  Pulse Rate Source Dinamap  Resp (!) 24  Level of Consciousness  Level of Consciousness Alert  MEWS COLOR  MEWS Score Color Green  Oxygen Therapy  SpO2 100 %  O2 Device Room Air

## 2020-11-03 ENCOUNTER — Other Ambulatory Visit: Payer: Self-pay | Admitting: Medical

## 2020-11-03 DIAGNOSIS — I639 Cerebral infarction, unspecified: Secondary | ICD-10-CM

## 2020-11-03 LAB — BASIC METABOLIC PANEL
Anion gap: 9 (ref 5–15)
BUN: 15 mg/dL (ref 8–23)
CO2: 25 mmol/L (ref 22–32)
Calcium: 8.5 mg/dL — ABNORMAL LOW (ref 8.9–10.3)
Chloride: 102 mmol/L (ref 98–111)
Creatinine, Ser: 0.69 mg/dL (ref 0.44–1.00)
GFR, Estimated: >60 mL/min (ref >60)
Glucose, Bld: 106 mg/dL — ABNORMAL HIGH (ref 70–99)
Potassium: 3.8 mmol/L (ref 3.5–5.1)
Sodium: 136 mmol/L (ref 135–145)

## 2020-11-03 LAB — GLUCOSE, CAPILLARY
Glucose-Capillary: 89 mg/dL (ref 70–99)
Glucose-Capillary: 140 mg/dL — ABNORMAL HIGH (ref 70–99)

## 2020-11-03 LAB — MAGNESIUM: Magnesium: 1.9 mg/dL (ref 1.7–2.4)

## 2020-11-03 MED ORDER — AMLODIPINE BESYLATE 5 MG PO TABS: 5.0000 mg | ORAL_TABLET | Freq: Every day | ORAL | 0 refills | Status: AC

## 2020-11-03 MED ORDER — ASPIRIN 81 MG PO TBEC: 81.0000 mg | DELAYED_RELEASE_TABLET | Freq: Every day | ORAL | 0 refills | Status: AC

## 2020-11-03 MED ORDER — ATORVASTATIN CALCIUM 40 MG PO TABS: 40.0000 mg | ORAL_TABLET | Freq: Every day | ORAL | 0 refills | Status: AC

## 2020-11-03 MED ORDER — CLOPIDOGREL BISULFATE 75 MG PO TABS: 75.0000 mg | ORAL_TABLET | Freq: Every day | ORAL | 0 refills | Status: AC

## 2020-11-03 NOTE — Progress Notes (Signed)
   Cardiology asked to arrange a 30-day monitor to further evaluate CVA. Patient does not follow with our group. Will arrange for Dr. Audie Box to read as he is DOD in the hospital 11/03/20.   Abigail Butts, PA-C 11/03/20;11:39 AM

## 2020-11-03 NOTE — Plan of Care (Signed)
  Problem: Health Behavior/Discharge Planning: Goal: Ability to manage health-related needs will improve Outcome: Progressing   Problem: Clinical Measurements: Goal: Will remain free from infection Outcome: Progressing   Problem: Education: Goal: Knowledge of disease or condition will improve Outcome: Progressing Goal: Knowledge of secondary prevention will improve Outcome: Progressing Goal: Knowledge of patient specific risk factors addressed and post discharge goals established will improve Outcome: Progressing

## 2020-11-03 NOTE — Progress Notes (Signed)
Attempted to call report to facility again. No one answered the phone.

## 2020-11-03 NOTE — TOC Transition Note (Signed)
Transition of Care [facility]) - CM/SW Discharge Note   Patient Details  Name: Brittany Macdonald MRN: [id number] Date of Birth: 11-Aug-1936  [facility] CM/SW Contact:  Pollie Friar, RN Phone Number: 11/03/2020, 10:33 AM   Clinical Narrative:    Pt discharging to [facility] today. Pt transporting via [facility]. Granddaughter aware. CM will update bedside RN and place d/c packet at the desk.  Room: 1206P Number for report: [phone]   Final next level of care: Skilled Nursing Facility Barriers to Discharge: No Barriers Identified   Patient Goals and CMS Choice Patient states their goals for this hospitalization and ongoing recovery are:: Rehab CMS Medicare.gov Compare Post Acute Care list provided to:: Patient Represenative (must comment) Choice offered to / list presented to :  (Granddaughter)  Discharge Placement              Patient chooses bed at: [facility] Patient to be transferred to facility by: [place] Name of family member notified: granddaughter Patient and family notified of of transfer: 11/03/20  Discharge Plan and Services In-house Referral: Clinical Social Work   Post Acute Care Choice: [place]                               Social Determinants of Health (SDOH) Interventions     Readmission Risk Interventions No flowsheet data found.

## 2020-11-03 NOTE — Discharge Summary (Signed)
[facility] Discharge Summary   Patient: Brittany Macdonald FYB:[id number]  PCP: [name], MD  Date of admission: 10/28/2020   Date of discharge:  11/03/2020     Discharge Diagnoses:  Principal Problem:   CVA (cerebral vascular accident) [facility]) Active Problems:   Aphasia   Left leg weakness   Diabetes mellitus type 2, uncontrolled ([place])   Acute metabolic encephalopathy   Obesity, Class III, BMI 40-49.9 (morbid obesity) ([place])   Admitted From: home Disposition:  SNF   Recommendations for Outpatient Follow-up:  1. PCP: please follow up with PCP and Neurology as recommended 2. Follow up LABS/TEST:  none   Follow-up Information    [facility]. Schedule an appointment as soon as possible for a visit in 4 week(s).   Specialty: Neurology Contact information: [address] [place] [phone]       [name], MD. Schedule an appointment as soon as possible for a visit in 1 week(s).   Specialty: Family Medicine Contact information: [address]  [address] [place] [address] [phone]              Diet recommendation: Cardiac diet  Activity: The patient is advised to gradually reintroduce usual activities, as tolerated  Discharge Condition: stable  Code Status: Full code   History of present illness: As per the H and P dictated on admission, " Brittany Macdonald is a 84 y.o. female with medical history significant of hypertension, diabetes mellitus type 2, fibromyalgia, GI bleed, GERD, dementia, OA, and RA presents with reports of weakness.  History is obtained mostly from review of records as the patient is unable to provide any significant history.  She reportedly has been having weakness for the last 2 months.  Patient repeats "everything that I took is everything I had" to most questions.  She is able to answer yes and no questions, but denies any complaints at this time except for  weakness in the left leg.   ED Course: Upon admission into the emergency department patient was seen to be afebrile with blood pressures 153/107-165/97, and all other vital signs maintained.  Initial CT scan of the brain showed small diffuse cortical atrophy, but no acute abnormalities.  Patient was not a candidate for TPA as there was unclear onset of symptoms.  Labs were relatively unremarkable except for elevated glucose of 263.  Neurology had been consulted to evaluate the patient.  [facility] called to admit."  Hospital Course:  Summary of her active problems in the hospital is as following. 1.Left MCA infarct Petechial hemorrhage Hyperlipidemia Neurology consulted. CT head unremarkable. MRI brain shows subacute left anterior CVA with petechial hemorrhage. MRI also shows moderate stenosis of left M3 A2 and PCA. 2D echo shows EF of 55 to 60%. LDL 201. Currently on Lipitor 40 mg. Neurology recommends 30-day cardiac event monitoring outpatient to rule out A. fib. Currently on 81 mg aspirin and Plavix 75 mg. Dual antiplatelet therapy for 3 weeks followed by aspirin alone.  2.Essential hypertension Blood pressure stable. Continue to monitor.  3.Type 2 diabetes mellitus, uncontrolled with hyperglycemia with hyperlipidemia Hemoglobin A1c 7.4. Currently on sliding scale insulin. Monitor.  4.Fibromyalgia Osteoarthritis Rheumatoid arthritis Not on any medication for her arthritis. Unsure whether this diagnosis is accurate. Bilateral knee pain likely osteoarthritis. Continue as needed Tylenol. No trauma seen from the outside therefore no imaging for now.  5.Dementia No behavioral issues. Appears to be baseline.  Remains intermittently confused. Not on  medication outpatient.  Patient was seen by physical therapy, who recommended SNF,  was arranged. On the day of the discharge the patient's vitals were stable, and no other acute medical condition were reported by patient. The  patient was felt safe to be discharge at SNF with therapy.  Consultants: Neurology  Procedures: Echocardiogram   Discharge Exam: General: Appear in no distress, no Rash; Oral Mucosa Clear, moist. no Abnormal Neck Mass Or lumps, Conjunctiva normal  Cardiovascular: S1 and S2 Present, no Murmur Respiratory: good respiratory effort, Bilateral Air entry present and CTA, no Crackles, no wheezes Abdomen: Bowel Sound present, Soft and no tenderness Extremities: no Pedal edema Neurology: alert and oriented to place and person affect appropriate. no new focal deficit  Filed Weights   11/01/20 0500 11/02/20 0417 11/03/20 0430  Weight: 79.1 kg 79.7 kg 79 kg   Vitals:   11/03/20 0430 11/03/20 0504  BP: (!) 135/56 (!) 116/57  Pulse: 78 78  Resp: 16   Temp: 98.4 F (36.9 C)   SpO2: 100%     DISCHARGE MEDICATION: Allergies as of 11/03/2020      Reactions   Penicillins Anaphylaxis   Ivp Dye [iodinated Diagnostic Agents] Other (See Comments)   Goes into shock   Metoprolol Other (See Comments)   Headache    Peanut-containing Drug Products Other (See Comments)   Rupture stomach       Medication List    TAKE these medications   amLODipine 5 MG tablet Commonly known as: NORVASC Take 1 tablet (5 mg total) by mouth daily.   aspirin 81 MG EC tablet Take 1 tablet (81 mg total) by mouth daily. Swallow whole.   atorvastatin 40 MG tablet Commonly known as: LIPITOR Take 1 tablet (40 mg total) by mouth daily.   clopidogrel 75 MG tablet Commonly known as: PLAVIX Take 1 tablet (75 mg total) by mouth daily for 16 days.   insulin aspart 100 UNIT/ML injection Commonly known as: novoLOG Inject 5 Units into the skin 3 (three) times daily before meals.   insulin glargine 100 UNIT/ML injection Commonly known as: LANTUS Inject 0.2 mLs (20 Units total) into the skin at bedtime.      Allergies  Allergen Reactions  . Penicillins Anaphylaxis  . Ivp Dye [Iodinated Diagnostic Agents] Other  (See Comments)    Goes into shock  . Metoprolol Other (See Comments)    Headache   . Peanut-Containing Drug Products Other (See Comments)    Rupture stomach    Discharge Instructions    Ambulatory referral to Neurology   Complete by: As directed    Follow up with stroke clinic NP (Jessica Vanschaick or Cecille Rubin, if both not available, consider Zachery Dauer, or A[facility]) at [facility] in about 4 weeks. Thanks.   Diet - low sodium heart healthy   Complete by: As directed    Increase activity slowly   Complete by: As directed       The results of significant diagnostics from this hospitalization (including imaging, microbiology, ancillary and laboratory) are listed below for reference.    Significant Diagnostic Studies: DG Chest 2 View  Result Date: 10/29/2020 CLINICAL DATA:  Screening for metallic implants prior to MRI EXAM: CHEST - 2 VIEW COMPARISON:  06/26/2015 FINDINGS: Heart size is upper limits of normal. Atherosclerotic calcification of the aortic knob. Chronic elevation of the right hemidiaphragm. No focal airspace consolidation, pleural effusion, or pneumothorax. Degenerative changes of the bilateral shoulders. No radiopaque foreign body identified within the chest. IMPRESSION:  No active cardiopulmonary disease. No metallic foreign body identified within the chest. Electronically Signed   By: Davina Poke D.O.   On: 10/29/2020 14:57   DG Abd 1 View  Result Date: 10/29/2020 CLINICAL DATA:  Screening for MRI. EXAM: ABDOMEN - 1 VIEW COMPARISON:  Noncontrast abdominopelvic CT 05/15/2016 FINDINGS: Bilateral total hip arthroplasties. No other implanted medical device or radiopaque foreign body to preclude MRI imaging. Normal bowel gas pattern with moderate volume of stool. Vascular calcifications. Popcorn calcification in the pelvis consistent with phleboliths. IMPRESSION: Bilateral total hip arthroplasties. No other implanted medical device or radiopaque foreign body to preclude MRI  imaging. Electronically Signed   By: Keith Rake M.D.   On: 10/29/2020 14:58   CT HEAD WO CONTRAST  Result Date: 10/29/2020 CLINICAL DATA:  Increased weakness. EXAM: CT HEAD WITHOUT CONTRAST TECHNIQUE: Contiguous axial images were obtained from the base of the skull through the vertex without intravenous contrast. COMPARISON:  None. FINDINGS: Brain: Mild diffuse cortical atrophy is noted. Mild chronic ischemic white matter disease is noted. No mass effect or midline shift is noted. Ventricular size is within normal limits. There is no evidence of mass lesion, hemorrhage or acute infarction. Vascular: No hyperdense vessel or unexpected calcification. Skull: Normal. Negative for fracture or focal lesion. Sinuses/Orbits: No acute finding. Other: None. IMPRESSION: Mild diffuse cortical atrophy. Mild chronic ischemic white matter disease. No acute intracranial abnormality seen. Electronically Signed   By: Marijo Conception M.D.   On: 10/29/2020 08:34   MR ANGIO HEAD WO CONTRAST  Result Date: 10/29/2020 CLINICAL DATA:  84 year old female with increased weakness, stroke-like symptoms. MRI today suggesting subacute left anterior division MCA infarct. EXAM: MRA HEAD WITHOUT CONTRAST TECHNIQUE: Angiographic images of the Circle of Willis were obtained using MRA technique without intravenous contrast. COMPARISON:  Brain MRI 1457 hours today. FINDINGS: Antegrade flow in the posterior circulation. Fairly codominant distal vertebral arteries without stenosis. Both PICA origins are patent. Patent vertebrobasilar junction and basilar artery with mild irregularity and stenosis. Patent SCA and PCA origins. Moderate left P1, P2, and right P2 PCA stenoses. Furthermore there is duplication of the left PCA by the Pcomm (each supplying either superior or inferior PCA divisions). And there are also moderate to severe stenoses of the distal left Pcomm fairly symmetric distal PCA branch flow signal. Normal left posterior  communicating artery origin, the right is diminutive or absent. Antegrade flow in both ICA siphons. Tortuous right siphon just below the skull base. Both ICAs are patent to the terminus with mild to moderate irregularity greater on the left, but only mild left siphon stenosis. Patent MCA and ACA origins. The right A1 is dominant and the left diminutive. Anterior communicating artery is normal. Median artery of the corpus callosum is present (normal variant). There are moderate bilateral ACA A2 stenoses. Preserved distal ACA flow signal. Right MCA M1 and bifurcation are patent without stenosis. Mild right MCA branch irregularity. Left MCA M1 and left MCA bifurcation are patent without stenosis. Moderate irregularity of visible left MCA M3 branches. No discrete branch occlusion identified. IMPRESSION: No large vessel occlusion or Left MCA branch occlusion identified, but widespread intracranial atherosclerosis and stenosis: Moderate stenosis of visible Left MCA M3 branches. Moderate stenosis of bilateral ACA A2 segments. Moderate to severe stenosis of bilateral PCAs. Electronically Signed   By: Genevie Ann M.D.   On: 10/29/2020 18:05   MR ANGIO NECK W WO CONTRAST  Result Date: 10/31/2020 CLINICAL DATA:  Carotid artery stenosis. Partial expressive aphasia.  Subacute left MCA infarct on MRI. EXAM: MRA NECK WITHOUT AND WITH CONTRAST TECHNIQUE: Multiplanar and multiecho pulse sequences of the neck were obtained without and with intravenous contrast. Angiographic images of the neck were obtained using MRA technique without and with intravenous contrast. CONTRAST:  32mL GADAVIST GADOBUTROL 1 MMOL/ML IV SOLN COMPARISON:  None. FINDINGS: There is a standard 3 vessel aortic arch. The brachiocephalic and subclavian arteries are widely patent. The common carotid and cervical internal carotid arteries are patent bilaterally without evidence of a significant stenosis or dissection. Irregularity of the distal right common carotid  artery at the bifurcation likely reflects atherosclerosis and results in less than 50% luminal narrowing. The proximal right common carotid artery, and distal right cervical ICA, and proximal left ICA are tortuous. The vertebral arteries are patent and codominant with antegrade flow bilaterally. Focal signal loss at the vertebral artery origins suggests potential severe stenoses bilaterally, right greater than left. There is asymmetric diffuse tortuosity of the right V2 segment. Widespread intracranial atherosclerosis and associated stenoses were more fully evaluated on the recent head MRA. IMPRESSION: 1. Patent cervical carotid arteries without significant stenosis. 2. Patent vertebral arteries with potential severe stenoses at both vertebral artery origins. Electronically Signed   By: Logan Bores M.D.   On: 10/31/2020 06:20   MR BRAIN WO CONTRAST  Result Date: 10/29/2020 CLINICAL DATA:  84 year old female with increased weakness, stroke-like symptoms. EXAM: MRI HEAD WITHOUT CONTRAST TECHNIQUE: Multiplanar, multiecho pulse sequences of the brain and surrounding structures were obtained without intravenous contrast. COMPARISON:  Head CT 0810 hours today. FINDINGS: Brain: No convincing restricted diffusion to suggest acute infarction. Mild susceptibility artifact on DWI in the anterior left middle frontal gyrus related to confluent linear blood products (series 7, image 66) with mild surrounding cerebral edema and/or developing myelomalacia (series 6, image 17). Similar gyral signal abnormality tracking toward the anterior left operculum, also with microhemorrhage (series 7, image 53). Superimposed confluent bilateral cerebral white matter T2 and FLAIR hyperintensity. No other cortical signal changes identified. A few scattered chronic microhemorrhages elsewhere in both hemispheres (such as bilaterally series 7, image 57). No midline shift, mass effect, evidence of mass lesion, ventriculomegaly, extra-axial  collection. Cervicomedullary junction and pituitary are within normal limits. Dilated perivascular spaces in the basal ganglia. Otherwise the deep gray nuclei, brainstem and cerebellum remain normal for age. Vascular: Major intracranial vascular flow voids are preserved. Generalized arterial tortuosity. Skull and upper cervical spine: Visible cervical spine degeneration is concordant with age, including degenerative ligamentous hypertrophy about the odontoid. Visualized bone marrow signal is within normal limits. Sinuses/Orbits: Negative orbits. Paranasal Visualized paranasal sinuses and mastoids are stable and well pneumatized. Other: Grossly normal visible internal auditory structures. IMPRESSION: 1. Abnormal left anterior middle frontal gyrus and anterior operculum most suggestive of a subacute infarct with petechial hemorrhage. No active bleeding or mass effect. 2. No other acute intracranial abnormality. Scattered chronic microhemorrhages elsewhere in the brain, although less than typical of amyloid angiopathy. Advanced bilateral white matter disease. Electronically Signed   By: Genevie Ann M.D.   On: 10/29/2020 15:34   ECHOCARDIOGRAM COMPLETE  Result Date: 10/30/2020    ECHOCARDIOGRAM REPORT   Patient Name:   TASHINA CREDIT Date of Exam: 10/30/2020 Medical Rec #:  [id number]          Height:       66.0 in Accession #:    [id number]         Weight:       257.0 lb Date  of Birth:  04-20-1936          BSA:          2.225 m Patient Age:    65 years           BP:           143/90 mmHg Patient Gender: F                  HR:           93 bpm. Exam Location:  Inpatient Procedure: 2D Echo, Color Doppler and Cardiac Doppler Indications:    R67.89 Acute systolic (congestive) heart failure  History:        Patient has no prior history of Echocardiogram examinations.                 Risk Factors:Hypertension and Diabetes.  Sonographer:    Raquel Sarna Senior RDCS Referring Phys: [phone] [place]  1. Left  ventricular ejection fraction, by estimation, is 55 to 60%. The left ventricle has normal function. The left ventricle has no regional wall motion abnormalities. There is moderate left ventricular hypertrophy. Left ventricular diastolic parameters are consistent with Grade I diastolic dysfunction (impaired relaxation).  2. Right ventricular systolic function is normal. The right ventricular size is normal. Tricuspid regurgitation signal is inadequate for assessing PA pressure.  3. The mitral valve is normal in structure. No evidence of mitral valve regurgitation. Moderate mitral annular calcification.  4. The aortic valve is tricuspid. Aortic valve regurgitation is not visualized. Mild aortic valve sclerosis is present, with no evidence of aortic valve stenosis.  5. The inferior vena cava is normal in size with greater than 50% respiratory variability, suggesting right atrial pressure of 3 mmHg. FINDINGS  Left Ventricle: Left ventricular ejection fraction, by estimation, is 55 to 60%. The left ventricle has normal function. The left ventricle has no regional wall motion abnormalities. The left ventricular internal cavity size was normal in size. There is  moderate left ventricular hypertrophy. Left ventricular diastolic parameters are consistent with Grade I diastolic dysfunction (impaired relaxation). Right Ventricle: The right ventricular size is normal. No increase in right ventricular wall thickness. Right ventricular systolic function is normal. Tricuspid regurgitation signal is inadequate for assessing PA pressure. Left Atrium: Left atrial size was normal in size. Right Atrium: Right atrial size was normal in size. Pericardium: There is no evidence of pericardial effusion. Mitral Valve: The mitral valve is normal in structure. There is mild calcification of the mitral valve leaflet(s). Moderate mitral annular calcification. No evidence of mitral valve regurgitation. Tricuspid Valve: The tricuspid valve is  normal in structure. Tricuspid valve regurgitation is not demonstrated. Aortic Valve: The aortic valve is tricuspid. Aortic valve regurgitation is not visualized. Mild aortic valve sclerosis is present, with no evidence of aortic valve stenosis. Pulmonic Valve: The pulmonic valve was normal in structure. Pulmonic valve regurgitation is not visualized. Aorta: The aortic root is normal in size and structure. Venous: The inferior vena cava is normal in size with greater than 50% respiratory variability, suggesting right atrial pressure of 3 mmHg. IAS/Shunts: No atrial level shunt detected by color flow Doppler.  LEFT VENTRICLE PLAX 2D LVIDd:         3.70 cm  Diastology LVIDs:         3.10 cm  LV e' medial:    4.68 cm/s LV PW:         1.40 cm  LV E/e' medial:  17.8 LV IVS:  1.50 cm  LV e' lateral:   5.11 cm/s LVOT diam:     1.90 cm  LV E/e' lateral: 16.3 LV SV:         47 LV SV Index:   21 LVOT Area:     2.84 cm  RIGHT VENTRICLE RV S prime:     15.10 cm/s TAPSE (M-mode): 2.2 cm LEFT ATRIUM             Index       RIGHT ATRIUM           Index LA diam:        3.00 cm 1.35 cm/m  RA Area:     11.50 cm LA Vol (A2C):   42.4 ml 19.06 ml/m RA Volume:   22.20 ml  9.98 ml/m LA Vol (A4C):   50.7 ml 22.79 ml/m LA Biplane Vol: 47.5 ml 21.35 ml/m  AORTIC VALVE LVOT Vmax:   89.70 cm/s LVOT Vmean:  61.800 cm/s LVOT VTI:    0.166 m  AORTA Ao Root diam: 3.30 cm Ao Asc diam:  3.00 cm MITRAL VALVE MV Area (PHT): 2.62 cm    SHUNTS MV Decel Time: 289 msec    Systemic VTI:  0.17 m MV E velocity: 83.20 cm/s  Systemic Diam: 1.90 cm MV A velocity: 95.40 cm/s MV E/A ratio:  0.87 Loralie Champagne MD Electronically signed by Loralie Champagne MD Signature Date/Time: 10/30/2020/5:06:45 PM    Final    VAS US CAROTID  Result Date: 10/30/2020 Carotid Arterial Duplex Study Indications:       CVA. Risk Factors:      Hypertension, Diabetes. Limitations        Today's exam was limited due to shadowing and patient                    anatomy.  Comparison Study:  No prior studies. Performing Technologist: Darlin Coco RDMS Supporting Technologist: Oda Cogan RDMS, RVT  Examination Guidelines: A complete evaluation includes B-mode imaging, spectral Doppler, color Doppler, and power Doppler as needed of all accessible portions of each vessel. Bilateral testing is considered an integral part of a complete examination. Limited examinations for reoccurring indications may be performed as noted.  Right Carotid Findings: +----------+--------+--------+--------+------------------+--------+           PSV cm/sEDV cm/sStenosisPlaque DescriptionComments +----------+--------+--------+--------+------------------+--------+ CCA Prox  75      13                                         +----------+--------+--------+--------+------------------+--------+ CCA Distal49      8                                          +----------+--------+--------+--------+------------------+--------+ ICA Prox  48      11      1-39%   calcific                   +----------+--------+--------+--------+------------------+--------+ ICA Distal69      17                                         +----------+--------+--------+--------+------------------+--------+ ECA       77                                                 +----------+--------+--------+--------+------------------+--------+ +----------+--------+-------+----------------+-------------------+  PSV cm/sEDV cmsDescribe        Arm Pressure (mmHG) +----------+--------+-------+----------------+-------------------+ Subclavian228            Multiphasic, WNL                    +----------+--------+-------+----------------+-------------------+ +---------+--------+--+--------+--+---------+ VertebralPSV cm/s54EDV cm/s13Antegrade +---------+--------+--+--------+--+---------+  Left Carotid Findings: +----------+--------+--------+--------+------------------+---------------------+            PSV cm/sEDV cm/sStenosisPlaque DescriptionComments              +----------+--------+--------+--------+------------------+---------------------+ CCA Prox  90                                                              +----------+--------+--------+--------+------------------+---------------------+ CCA Distal86      10                                                      +----------+--------+--------+--------+------------------+---------------------+ ICA Prox                                            Difficult to                                                              visualize due to                                                          patient anatomy and                                                       shadowing.            +----------+--------+--------+--------+------------------+---------------------+ ICA Distal69      11                                                      +----------+--------+--------+--------+------------------+---------------------+ ECA       56                                                              +----------+--------+--------+--------+------------------+---------------------+ +----------+--------+--------+----------------+-------------------+           PSV cm/sEDV cm/sDescribe  Arm Pressure (mmHG) +----------+--------+--------+----------------+-------------------+ Subclavian132             Multiphasic, WNL                    +----------+--------+--------+----------------+-------------------+ +---------+--------+--+--------+--+---------+ VertebralPSV cm/s63EDV cm/s15Antegrade +---------+--------+--+--------+--+---------+   Summary: Right Carotid: Velocities in the right ICA are consistent with a 1-39% stenosis. Left Carotid: Difficult to visualize left ICA due to patient anatomy and               shadowing. Vertebrals:  Bilateral vertebral arteries demonstrate antegrade flow.  Subclavians: Normal flow hemodynamics were seen in bilateral subclavian              arteries. *See table(s) above for measurements and observations.  Electronically signed by Ruta Hinds MD on 10/30/2020 at 7:39:14 PM.    Final     Microbiology: Recent Results (from the past 240 hour(s))  Resp Panel by RT-PCR (Flu A&B, Covid) Nasopharyngeal Swab     Status: None   Collection Time: 10/29/20 11:39 AM   Specimen: Nasopharyngeal Swab; Nasopharyngeal(NP) swabs in vial transport medium  Result Value Ref Range Status   SARS Coronavirus 2 by RT PCR NEGATIVE NEGATIVE Final    Comment: (NOTE) SARS-CoV-2 target nucleic acids are NOT DETECTED.  The SARS-CoV-2 RNA is generally detectable in upper respiratory specimens during the acute phase of infection. The lowest concentration of SARS-CoV-2 viral copies this assay can detect is 138 copies/mL. A negative result does not preclude SARS-Cov-2 infection and should not be used as the sole basis for treatment or other patient management decisions. A negative result may occur with  improper specimen collection/handling, submission of specimen other than nasopharyngeal swab, presence of viral mutation(s) within the areas targeted by this assay, and inadequate number of viral copies(<138 copies/mL). A negative result must be combined with clinical observations, patient history, and epidemiological information. The expected result is Negative.  Fact Sheet for Patients:  EntrepreneurPulse.com.au  Fact Sheet for Healthcare Providers:  IncredibleEmployment.be  This test is no t yet approved or cleared by the [place] FDA and  has been authorized for detection and/or diagnosis of SARS-CoV-2 by FDA under an Emergency Use Authorization (EUA). This EUA will remain  in effect (meaning this test can be used) for the duration of the COVID-19 declaration under Section 564(b)(1) of the Act, 21 U.S.C.section  360bbb-3(b)(1), unless the authorization is terminated  or revoked sooner.       Influenza A by PCR NEGATIVE NEGATIVE Final   Influenza B by PCR NEGATIVE NEGATIVE Final    Comment: (NOTE) The Xpert Xpress SARS-CoV-2/FLU/RSV plus assay is intended as an aid in the diagnosis of influenza from Nasopharyngeal swab specimens and should not be used as a sole basis for treatment. Nasal washings and aspirates are unacceptable for Xpert Xpress SARS-CoV-2/FLU/RSV testing.  Fact Sheet for Patients: EntrepreneurPulse.com.au  Fact Sheet for Healthcare Providers: IncredibleEmployment.be  This test is not yet approved or cleared by the [place] FDA and has been authorized for detection and/or diagnosis of SARS-CoV-2 by FDA under an Emergency Use Authorization (EUA). This EUA will remain in effect (meaning this test can be used) for the duration of the COVID-19 declaration under Section 564(b)(1) of the Act, 21 U.S.C. section 360bbb-3(b)(1), unless the authorization is terminated or revoked.  Performed at [facility], [place] [address] [place],  [address]   SARS Coronavirus 2 by RT PCR (hospital order, performed in [facility] hospital lab) Nasopharyngeal Nasopharyngeal Swab  Status: None   Collection Time: 11/02/20  3:26 PM   Specimen: Nasopharyngeal Swab  Result Value Ref Range Status   SARS Coronavirus 2 NEGATIVE NEGATIVE Final    Comment: (NOTE) SARS-CoV-2 target nucleic acids are NOT DETECTED.  The SARS-CoV-2 RNA is generally detectable in upper and lower respiratory specimens during the acute phase of infection. The lowest concentration of SARS-CoV-2 viral copies this assay can detect is 250 copies / mL. A negative result does not preclude SARS-CoV-2 infection and should not be used as the sole basis for treatment or other patient management decisions.  A negative result may occur with improper specimen collection / handling,  submission of specimen other than nasopharyngeal swab, presence of viral mutation(s) within the areas targeted by this assay, and inadequate number of viral copies (<250 copies / mL). A negative result must be combined with clinical observations, patient history, and epidemiological information.  Fact Sheet for Patients:   StrictlyIdeas.no  Fact Sheet for Healthcare Providers: BankingDealers.co.za  This test is not yet approved or  cleared by the [place] FDA and has been authorized for detection and/or diagnosis of SARS-CoV-2 by FDA under an Emergency Use Authorization (EUA).  This EUA will remain in effect (meaning this test can be used) for the duration of the COVID-19 declaration under Section 564(b)(1) of the Act, 21 U.S.C. section 360bbb-3(b)(1), unless the authorization is terminated or revoked sooner.  Performed at [facility], [place] [address], [place], [place] [address]      Labs: CBC: Recent Labs  Lab 10/28/20 1749 10/29/20 1043  WBC 8.1 8.0  NEUTROABS  --  5.2  HGB 12.5 12.3  HCT 37.2 38.3  MCV 90.5 91.6  PLT 199 619   Basic Metabolic Panel: Recent Labs  Lab 10/28/20 1749 11/02/20 0220 11/03/20 0455  NA 138 139 136  K 4.0 3.4* 3.8  CL 101 105 102  CO2 25 26 25   GLUCOSE 263* 80 106*  BUN 17 8 15   CREATININE 0.87 0.59 0.69  CALCIUM 9.1 8.8* 8.5*  MG  --  1.9 1.9   Liver Function Tests: Recent Labs  Lab 11/02/20 0220  AST 14*  ALT 9  ALKPHOS 50  BILITOT 0.7  PROT 5.8*  ALBUMIN 2.5*   CBG: Recent Labs  Lab 11/02/20 1233 11/02/20 1245 11/02/20 1622 11/02/20 2133 11/03/20 0620  GLUCAP 116* 136* 123* 179* 89    Time spent: 35 minutes  Signed:  Berle Mull  [facility]  11/03/2020 8:20 AM

## 2020-11-03 NOTE — Progress Notes (Signed)
Attempted to call report to facility.

## 2020-11-06 ENCOUNTER — Encounter: Payer: Self-pay | Admitting: *Deleted

## 2020-11-06 NOTE — Progress Notes (Signed)
Patient ID: Brittany Macdonald, female   DOB: June 26, 1936, 84 y.o.   MRN: [id number] Patient enrolled for [employer] to ship a 30 day cardiac event monitor to  [facility], Attn:  Humphrey Rolls, Nursing Supervisor, C/O [address], [place], [place], [place], [place]  [address].  Phone number is [phone]. Letter with instructions mailed to address above.

## 2020-11-13 ENCOUNTER — Encounter: Payer: Self-pay | Admitting: Neurology

## 2020-11-13 NOTE — Telephone Encounter (Signed)
This encounter was created in error - please disregard.

## 2020-12-01 ENCOUNTER — Ambulatory Visit (INDEPENDENT_AMBULATORY_CARE_PROVIDER_SITE_OTHER): Payer: Medicare Other | Admitting: Neurology

## 2020-12-01 ENCOUNTER — Other Ambulatory Visit: Payer: Self-pay

## 2020-12-01 ENCOUNTER — Encounter: Payer: Self-pay | Admitting: Neurology

## 2020-12-01 VITALS — BP 136/78 | HR 73 | Resp 20 | Ht 69.0 in

## 2020-12-01 DIAGNOSIS — I679 Cerebrovascular disease, unspecified: Secondary | ICD-10-CM | POA: Diagnosis not present

## 2020-12-01 DIAGNOSIS — I6330 Cerebral infarction due to thrombosis of unspecified cerebral artery: Secondary | ICD-10-CM | POA: Diagnosis not present

## 2020-12-01 DIAGNOSIS — F03B0 Unspecified dementia, moderate, without behavioral disturbance, psychotic disturbance, mood disturbance, and anxiety: Secondary | ICD-10-CM

## 2020-12-01 DIAGNOSIS — F0390 Unspecified dementia without behavioral disturbance: Secondary | ICD-10-CM

## 2020-12-01 NOTE — Progress Notes (Signed)
NEUROLOGY CONSULTATION NOTE  Brittany Macdonald MRN: [id number] DOB: 09/01/1936  Referring provider: Dr. Andree Moro Primary care provider: Dr. Jules Husbands  Reason for consult:  Dementia, stroke  Dear Dr Posey Pronto:  Thank you for your kind referral of Brittany Macdonald for consultation of the above symptoms. Although her history is well known to you, please allow me to reiterate it for the purpose of our medical record. She is alone in the office today. I tried to contact her granddaughter Ms. Brittany Macdonald listed as main contact, with no response. Records and images were personally reviewed where available.   HISTORY OF PRESENT ILLNESS: This is an 85 year old right-handed woman with a history of hypertension, diabetes, fibromyalgia, referred to our office for dementia. She is alone in the office today, I attempted to contact her granddaughter listed as main contact but there was no response. PCP referral notes from September 2021 indicate she could not recall her birthdate, current date, and current events. Her daughter reported she was grinding her teeth and rocking and moaning. She is usually unaware of behaviors. She has never been diagnosed with dementia in the past although she has a family history of dementia. Daughter also stated patient had decline in hygiene practices, appetite, and anhedonia. She used to do crossword puzzles and reading but then preferred to lay in bed. She was noncompliant with diabetes medications and did not want to restart it. SLUMS score was 5/30. She was admitted to [facility] on 10/28/2020 for weakness. She kept repeating "everything I took is everything I had." BP was elevated during her stay. I personally reviewed MRI brain without contrast which showed abnormal left anterior middle frontal gyrus and anterior operculum most suggestive of a subacute infarct with petechial hemorrhage, scattered chronic microhemorrhages, advanced chronic microvascular disease. MRA showed  moderate stenosis of left M3,bilateral ACA  A2 segments, and moderate to severe stenosis of bilateral PCAs. LDL was 201. She was discharged home on dual antiplatelet therapy with instructions to stop Plavix after 3 weeks. A 30-day holter monitor was ordered, there are no results available on EPIC, however she is now on Eliquis and aspirin.   She is a poor historian and appears aphasic. She is able to follow simple commands however she perseverates on November when asked questions for MMSE testing. When asked who she lives with at home, she states "my mom has been pretty good with me, mom's doing pretty good." She states she is doing pretty good herself, "I don't do too much, I sit back and take myself." She says both legs were weak. She denies any headaches, dizziness, diplopia, neck/back pain, focal numbness/tingling, no pain.   Laboratory Data: Lab Results  Component Value Date   TSH 1.698 10/29/2020   Lab Results  Component Value Date   CHOL 264 (H) 10/30/2020   HDL 45 10/30/2020   LDLCALC 201 (H) 10/30/2020   TRIG 90 10/30/2020   CHOLHDL 5.9 10/30/2020   Lab Results  Component Value Date   HGBA1C 7.4 (H) 10/30/2020     PAST MEDICAL HISTORY: Past Medical History:  Diagnosis Date  . Chronic neck pain   . Diabetes mellitus without complication ([place])   . Fibromyalgia   . GERD (gastroesophageal reflux disease)   . GI bleed 07/12/2014  . Hypertension   . Obesity   . Osteoarthritis   . Refusal of blood product   . Rheumatoid arthritis(714.0)     PAST SURGICAL HISTORY: Past Surgical History:  Procedure Laterality Date  .  COLONOSCOPY N/A 05/28/2014   Procedure: COLONOSCOPY;  Surgeon: Juanita Craver, MD;  Location: [facility] ENDOSCOPY;  Service: Endoscopy;  Laterality: N/A;  . ESOPHAGOGASTRODUODENOSCOPY N/A 05/27/2014   Procedure: ESOPHAGOGASTRODUODENOSCOPY (EGD);  Surgeon: Milus Banister, MD;  Location: [facility];  Service: Endoscopy;  Laterality: N/A;  . JOINT REPLACEMENT     bilat  hips  . TOTAL HIP ARTHROPLASTY Bilateral   . TUBAL LIGATION      MEDICATIONS: Current Outpatient Medications on File Prior to Visit  Medication Sig Dispense Refill  . amLODipine (NORVASC) 5 MG tablet Take 1 tablet (5 mg total) by mouth daily. 30 tablet 0  . aspirin EC 81 MG EC tablet Take 1 tablet (81 mg total) by mouth daily. Swallow whole. 120 tablet 0  . atorvastatin (LIPITOR) 40 MG tablet Take 1 tablet (40 mg total) by mouth daily. 30 tablet 0  . diclofenac (VOLTAREN) 0.1 % ophthalmic solution 4 (four) times daily.    Marland Kitchen ELIQUIS 5 MG TABS tablet Take 5 mg by mouth 2 (two) times daily.    . insulin aspart (NOVOLOG) 100 UNIT/ML injection Inject 5 Units into the skin 3 (three) times daily before meals.    . insulin glargine (LANTUS) 100 UNIT/ML injection Inject 0.2 mLs (20 Units total) into the skin at bedtime.    . senna-docusate (SENOKOT-S) 8.6-50 MG tablet Take 1 tablet by mouth daily.    . traMADol (ULTRAM) 50 MG tablet Take 50 mg by mouth daily as needed.    . metFORMIN (GLUCOPHAGE) 500 MG tablet Take 500 mg by mouth daily.     No current facility-administered medications on file prior to visit.     FAMILY HISTORY: History reviewed. No pertinent family history.  SOCIAL HISTORY: Social History   Socioeconomic History  . Marital status: Widowed    Spouse name: Not on file  . Number of children: Not on file  . Years of education: Not on file  . Highest education level: Not on file  Occupational History  . Not on file  Tobacco Use  . Smoking status: Never Smoker  . Smokeless tobacco: Never Used  Substance and Sexual Activity  . Alcohol use: No  . Drug use: No  . Sexual activity: Not on file  Other Topics Concern  . 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 Perseverates with "November," giving the same response to all questions on MMSE. Able to do 2 steps of 3-step command, read and follow direction to close eyes. No dysarthria. Fund of knowledge is reduced.  Recent and remote memory are impaired.  Attention and concentration are reduced.   Unable to name objects and repeat phrases. MMSE 3/30 MMSE - Mini Mental State Exam 12/01/2020  Orientation to time 0  Orientation to Place 0  Registration 0  Attention/  Calculation 0  Recall 0  Language- name 2 objects 0  Language- repeat 0  Language- follow 3 step command 2  Language- read & follow direction 1  Write a sentence 0  Copy design 0  Total score 3    Cranial nerves: CN I: not tested CN II: pupils equal, round and reactive to light, visual fields intact CN III, IV, VI:  full range of motion, no nystagmus, no ptosis CN V: facial sensation intact CN VII: upper and lower face symmetric CN VIII: hearing intact to conversation CN IX, X: gag intact, uvula midline CN XI: sternocleidomastoid and trapezius muscles intact CN XII: tongue midline Bulk & Tone: normal, no fasciculations. Motor: 5/5 on both UE, right LE, 4/5 left LE Sensation: intact to light touch, cold Deep Tendon Reflexes: +1 throughout Cerebellar: no incoordination on finger to nose testing Gait: not tested Tremor: none   IMPRESSION: This is an 85 year old right-handed woman with a history of hypertension, diabetes, fibromyalgia, referred to our office for dementia, also  recently admitted in 10/2020 for left MCA stroke with intracranial stenosis. She is now also on Eliquis for presumed atrial fibrillation (no holter monitor results available for review). She was referred for evaluation of dementia, which she clearly has, MMSE today 3/30. She is also aphasic now. She had dementia prior to the stroke. MRI brain showed subacute infarct in the left anterior middle frontal gyrus and anterior operculum, advanced chronic microvascular disease, intracranial stenosis of left M3, bilateral ACA A2 segments, and bilateral PCAs. There would be no benefit to starting dementia medications at this point. She is on Eliquis, as well as aspirin for intracranial stenosis. No family members to discuss plan of care, I tried to contact listed primary contact and left voicemail. She now resides in a SNF, no staff was present to provide information as well. Follow-up as needed, call for any changes.    Thank you for allowing me to participate in the care of this patient. Please do not hesitate to call for any questions or concerns.   Ellouise Newer, M.D.  CC: Dr. Orland Mustard, Dr. Keenan Bachelor

## 2021-01-05 ENCOUNTER — Other Ambulatory Visit (HOSPITAL_COMMUNITY): Payer: Self-pay

## 2021-01-05 DIAGNOSIS — R059 Cough, unspecified: Secondary | ICD-10-CM

## 2021-01-05 DIAGNOSIS — R1310 Dysphagia, unspecified: Secondary | ICD-10-CM

## 2021-01-10 ENCOUNTER — Encounter (HOSPITAL_COMMUNITY): Payer: Medicare Other

## 2021-01-10 ENCOUNTER — Encounter (HOSPITAL_COMMUNITY): Payer: Self-pay

## 2021-01-10 ENCOUNTER — Ambulatory Visit (HOSPITAL_COMMUNITY): Payer: Medicare Other

## 2021-01-11 ENCOUNTER — Other Ambulatory Visit: Payer: Self-pay

## 2021-01-11 ENCOUNTER — Non-Acute Institutional Stay: Payer: Self-pay | Admitting: Student

## 2021-01-11 DIAGNOSIS — Z515 Encounter for palliative care: Secondary | ICD-10-CM

## 2021-01-11 NOTE — Progress Notes (Signed)
[facility] Consult Note Telephone: [phone]  Fax: [phone]  PATIENT NAME: Brittany Macdonald [address] [place] [address] [id number] (home)  DOB: 23-Jul-1936 MRN: [id number]  PRIMARY CARE PROVIDER:    Raymondo Band, MD,  [place] [address] [phone]  REFERRING PROVIDER:   Raymondo Band, MD [place],  [place] [address] [phone]  RESPONSIBLE PARTY:   Extended Emergency Contact Information Primary Emergency Contact: Lilia Argue [place] Mobile Phone: [phone] Relation: Granddaughter  I met face to face with patient and family in the facility.   ASSESSMENT AND RECOMMENDATIONS:   Advance Care Planning: Visit at the request of Dr. Keenan Bachelor for palliative consult. Visit consisted of building trust and discussions on Palliative care medicine as specialized medical care for people living with serious illness, aimed at facilitating improved quality of life through symptoms relief, assisting with advance care planning and establishing goals of care. Palliative care will continue to provide support to patient, family and the medical team.  NP will contact responsible party via phone regarding Palliative Medicine visit.   Goal of care: To maintain comfort.  Directives: MOST form; attempt CPR, limited interventions, antibiotics, IV fluids as indicated, feeding tube for defined trial period.  Symptom Management:   Pain-patient with osteoarthritis, fibromyalgia. Continue Voltaren gel 1% apply 2 grams to bilateral knees BID, apply lidoderm 4% patch after voltaren gel each AM, remove at HS.   Appetite-patient with decline in appetite; weight loss in past month. Started on mirtazapine 7.44m for appetite. Continue weekly routine weights, encourage food that patient enjoys. Continue magic cup TID and med pass supplement TID.   Follow up Palliative Care Visit:  Palliative care will continue to follow for complex decision making and symptom management. Return in 4 weeks or prn.   Cognitive / Functional decline: Patient requires assist with all adl's. Staff will continue to provide assistance with all adl's.   I spent  35 minutes providing this consultation, from 1:45pm to 2:20pm. Time includes time spent with patient/family, chart review, provider coordination, and documentation. More than 50% of the time in this consultation was spent counseling and coordinating communication.   CHIEF COMPLAINT: Palliative Medicine initial consult.  History obtained from review of EMR, discussion with primary team. Records reviewed and summarized below.  HISTORY OF PRESENT ILLNESS:  DEDITA WEYENBERGis a 85y.o. year old female with multiple medical problems including cerebral infarction, aphasia, dysphagia, gait abnormality, muscle weakness, fibromyalgia, cognitive deficit, T2DM, essential hypertension, hx of DVT, RA, OA. Palliative Care was asked to follow this patient by consultation request of BRaymondo Band MD to help address advance care planning and goals of care. This is an initial visit.  [facility] She requires assistance with all adl's. She reports knee/arthritis pain. Receives voltaren gel, lidoderm patch. Staff report decline in appetite; 20 pound loss reported in past month. Weight 171.2 pounds 11/30/2020, weight 01/09/2021 151.6 pounds. Wound nurse reports DTI to left LE; daily wound care per nurse.   CODE STATUS: Full Code  PPS: 40%  HOSPICE ELIGIBILITY/DIAGNOSIS: TBD  ROS   General: NAD EYES: denies vision changes ENMT: denies dysphagia Cardiovascular: denies chest pain Pulmonary: denies cough, denies increased SOB Abdomen: endorses fair appetite GU: denies dysuria, endorses incontinence of urine MSK: no falls reported Skin: denies rashes, DTI to LLE per staff Neurological: endorses weakness Psych:  Endorses positive mood Heme/lymph/immuno: denies  bruises, abnormal bleeding   Physical Exam: Weight: 01/09/2021 151.6 pounds, 11/30/2020 171.2 pounds Constitutional: NAD General: frail appearing EYES: anicteric sclera, lids intact, no discharge  ENMT: intact hearing,oral mucous membranes moist CV: RRR,  trace LE edema Pulmonary: LCTA,  no increased work of breathing, no cough, no audible wheezes, on room air Abdomen: soft, non-tender, bowel sounds normoactive x 4 GU: deferred MSK: mild sarcopenia, non ambulatory Skin: warm and dry, dressing to bilateral feet CDI Neuro: Generalized weakness, alert and oriented to person Psych: pleasant, non-anxious affect Hem/lymph/immuno: no widespread bruising   PAST MEDICAL HISTORY:  Past Medical History:  Diagnosis Date  . Chronic neck pain   . Diabetes mellitus without complication ([place])   . Fibromyalgia   . GERD (gastroesophageal reflux disease)   . GI bleed 07/12/2014  . Hypertension   . Obesity   . Osteoarthritis   . Refusal of blood product   . Rheumatoid arthritis(714.0)     SOCIAL HX:  Social History   Tobacco Use  . Smoking status: Never Smoker  . Smokeless tobacco: Never Used  Substance Use Topics  . Alcohol use: No   FAMILY HX: No family history on file.     PERTINENT MEDICATIONS:  Outpatient Encounter Medications as of 01/11/2021  Medication Sig  . amLODipine (NORVASC) 5 MG tablet Take 1 tablet (5 mg total) by mouth daily.  Marland Kitchen aspirin EC 81 MG EC tablet Take 1 tablet (81 mg total) by mouth daily. Swallow whole.  Marland Kitchen atorvastatin (LIPITOR) 40 MG tablet Take 1 tablet (40 mg total) by mouth daily.  . diclofenac (VOLTAREN) 0.1 % ophthalmic solution 4 (four) times daily.  Marland Kitchen ELIQUIS 5 MG TABS tablet Take 5 mg by mouth 2 (two) times daily.  . insulin aspart (NOVOLOG) 100 UNIT/ML injection Inject 5 Units into the skin 3 (three) times daily before meals.  . insulin glargine (LANTUS) 100 UNIT/ML injection Inject 0.2 mLs (20 Units  total) into the skin at bedtime.  . metFORMIN (GLUCOPHAGE) 500 MG tablet Take 500 mg by mouth daily.  Marland Kitchen senna-docusate (SENOKOT-S) 8.6-50 MG tablet Take 1 tablet by mouth daily.  . traMADol (ULTRAM) 50 MG tablet Take 50 mg by mouth daily as needed.   No facility-administered encounter medications on file as of 01/11/2021.     Thank you for the opportunity to participate in the care of [name]. The palliative care team will continue to follow. Please call our office at [phone] if we can be of additional assistance.  Ezekiel Slocumb, NP, AGPCNP-C

## 2021-01-12 ENCOUNTER — Telehealth: Payer: Self-pay | Admitting: Student

## 2021-01-12 NOTE — Telephone Encounter (Signed)
Palliative NP spoke with patient's grand daughter regarding visit yesterday. Reviewed MOST form,  Brittany Macdonald would like patient changed to an DNR, otherwise, continue limited interventions, antibiotics and IV fluids as indicated, feeding tube for a defined trial period. She is encouraged to call with any questions or concerns as they arise.

## 2021-01-16 ENCOUNTER — Ambulatory Visit (HOSPITAL_COMMUNITY)
Admission: RE | Admit: 2021-01-16 | Discharge: 2021-01-16 | Disposition: A | Discharge status: Home / Self Care | Payer: Medicare Other | Source: Ambulatory Visit | Attending: Family Medicine | Admitting: Family Medicine

## 2021-01-16 ENCOUNTER — Other Ambulatory Visit: Payer: Self-pay

## 2021-01-16 DIAGNOSIS — R059 Cough, unspecified: Secondary | ICD-10-CM | POA: Insufficient documentation

## 2021-01-16 DIAGNOSIS — R1310 Dysphagia, unspecified: Secondary | ICD-10-CM | POA: Insufficient documentation

## 2021-03-13 ENCOUNTER — Other Ambulatory Visit: Payer: Self-pay

## 2021-03-13 ENCOUNTER — Non-Acute Institutional Stay: Payer: Medicare Other | Admitting: Student

## 2021-03-13 DIAGNOSIS — R6889 Other general symptoms and signs: Secondary | ICD-10-CM

## 2021-03-13 DIAGNOSIS — R634 Abnormal weight loss: Secondary | ICD-10-CM

## 2021-03-13 DIAGNOSIS — E43 Unspecified severe protein-calorie malnutrition: Secondary | ICD-10-CM

## 2021-03-13 DIAGNOSIS — Z515 Encounter for palliative care: Secondary | ICD-10-CM

## 2021-03-13 DIAGNOSIS — S31000D Unspecified open wound of lower back and pelvis without penetration into retroperitoneum, subsequent encounter: Secondary | ICD-10-CM

## 2021-03-13 NOTE — Progress Notes (Signed)
[facility] Consult Note Telephone: [phone]  Fax: [phone]    Date of encounter: 03/13/21 PATIENT NAME: Brittany Macdonald [address] [place] [address]   [phone] (home)  DOB: [birth year] MRN: [id number] PRIMARY CARE PROVIDER:    Raymondo Band, MD,  [address] [place] [address] [phone]  REFERRING PROVIDER:   Raymondo Band, MD [place],  [place] [address] [phone]  RESPONSIBLE PARTY:    Contact Information    Name Relation Home Work Mobile   Shaw Granddaughter   [phone]       I met face to face with patient and family in the facility. Palliative Care was asked to follow this patient by consultation request of  Raymondo Band, MD to address advance care planning and complex medical decision making. This is a follow up visit.                                   ASSESSMENT AND Macdonald / RECOMMENDATIONS:   Advance Care Planning/Goals of Care: Goals include to maximize quality of life and symptom management. Our advance care planning conversation included a discussion about:     The value and importance of advance care planning   Experiences with loved ones who have been seriously ill or have died   Exploration of personal, cultural or spiritual beliefs that might influence medical decisions   Exploration of goals of care in the event of a sudden injury or illness   Identification and preparation of a healthcare agent   Review and updating or creation of an  advance directive document .  Decision not to resuscitate or to de-escalate disease focused treatments due to poor prognosis.  CODE STATUS: DNR  Symptom Management/Macdonald:  Weight loss/protein calorie malnutrition-patient with 43 pound weight loss in the past 3 months, in spite of current interventions in place. Albumin 11/02/20 2.5. Staff to continue feeding/assist with meals. Continue med pass TID,  magic cup TID, mirtazapine 7.68m QHS, eldertonic QHS. Mechanical soft diet with ground meats.   Wounds-patient with new right heel suspected DTI, new stage 3 sacral wound. Continue daily wound care as directed by nursing staff. Air mattress ordered for bed.   Message left for responsible party/grand daughter SMaris Berger awaiting return call. Would like to refer patient for hospice services due to ongoing weight loss, protein calorie malnutrition, hx of cerebral infarction. Discussed with facility nurse and SW.  Follow up Palliative Care Visit: Palliative care will continue to follow for complex medical decision making, advance care planning, and clarification of goals. Return in 4 weeks or prn.  I spent 35 minutes providing this consultation. More than 50% of the time in this consultation was spent in counseling and care coordination.   PPS: 30%  HOSPICE ELIGIBILITY/DIAGNOSIS: weight loss/protein calorie malnutrition, hx of cerebral infarction.  Chief Complaint: Palliative Medicine follow up visit; weight loss.  HISTORY OF PRESENT ILLNESS:  DADALAI PERLis a 85y.o. year old female with cerebral infarction, aphasia, dysphagia, gait abnormality, muscle weakness, fibromyalgia, cognitive deficit, T2DM, essential hypertension, hx of DVT, RA, OA.   Patient continues to have weight loss in spite of current interventions. She has poor appetite, eating less than 25% of most meals. She also receives nutritional supplements TID.  She also has new wounds on 03/12/21; suspected DTI to right heel and stage 3 sacral wound. Generalized  weakness noted. She continues to sleep throughout the day. She is up to w/c daily for short periods. She is dependent for all adl's.   History obtained from review of EMR, discussion with primary team, and interview with family, facility staff/caregiver and/or Brittany Macdonald.  I reviewed available labs, medications, imaging, studies and related documents from the EMR.   Records reviewed and summarized above.   ROS  Patient unable to contribute to ROS due to impaired cognition.  Physical Exam: Weights: 03/13/21 128 pounds, 01/09/21 151.6 pounds, 11/30/20 171.2 pounds Constitutional: NAD General: frail appearing, thin EYES: anicteric sclera, lids intact, no discharge  ENMT: intact hearing, oral mucous membranes moist, dentition intact CV: S1S2, RRR, no LE edema Pulmonary: LCTA, no increased work of breathing, no cough, room air Abdomen: normo-active BS + 4 quadrants, soft and non tender GU: deferred MSK: sarcopenia, moves all extremities, non- ambulatory Skin: warm and dry Neuro: generalized weakness Psych: non-anxious affect, A & O to person Hem/lymph/immuno: no widespread bruising   Thank you for the opportunity to participate in the care of Brittany Macdonald.  The palliative care team will continue to follow. Please call our office at [phone] if we can be of additional assistance.   Ezekiel Slocumb, NP   COVID-19 PATIENT SCREENING TOOL Asked and negative response unless otherwise noted:   Have you had symptoms of covid, tested positive or been in contact with someone with symptoms/positive test in the past 5-10 days? No

## 2021-04-18 DEATH — deceased
